# Patient Record
Sex: Female | Born: 1975 | Race: Black or African American | Hispanic: No | Marital: Single | Smoking: Never smoker
Health system: Southern US, Community
[De-identification: ages and names within clinical notes are randomized; demographics above are authoritative.]

## PROBLEM LIST (undated history)

## (undated) DIAGNOSIS — I10 Essential (primary) hypertension: Secondary | ICD-10-CM

## (undated) HISTORY — PX: TUBAL LIGATION: SHX77

## (undated) HISTORY — DX: Essential (primary) hypertension: I10

## (undated) HISTORY — PX: ABDOMINAL SURGERY: SHX537

---

## 1997-06-06 ENCOUNTER — Emergency Department (HOSPITAL_COMMUNITY): Admission: EM | Admit: 1997-06-06 | Discharge: 1997-06-06 | Payer: Self-pay | Admitting: Emergency Medicine

## 1998-11-17 ENCOUNTER — Emergency Department (HOSPITAL_COMMUNITY): Admission: EM | Admit: 1998-11-17 | Discharge: 1998-11-17 | Payer: Self-pay | Admitting: Emergency Medicine

## 1999-10-13 ENCOUNTER — Inpatient Hospital Stay (HOSPITAL_COMMUNITY): Admission: AD | Admit: 1999-10-13 | Discharge: 1999-10-13 | Payer: Self-pay | Admitting: Obstetrics & Gynecology

## 1999-10-14 ENCOUNTER — Encounter: Payer: Self-pay | Admitting: Obstetrics & Gynecology

## 1999-10-14 ENCOUNTER — Inpatient Hospital Stay (HOSPITAL_COMMUNITY): Admission: AD | Admit: 1999-10-14 | Discharge: 1999-10-14 | Payer: Self-pay | Admitting: Obstetrics & Gynecology

## 1999-11-05 ENCOUNTER — Other Ambulatory Visit: Admission: RE | Admit: 1999-11-05 | Discharge: 1999-11-05 | Payer: Self-pay | Admitting: Obstetrics

## 1999-12-02 ENCOUNTER — Emergency Department (HOSPITAL_COMMUNITY): Admission: EM | Admit: 1999-12-02 | Discharge: 1999-12-02 | Payer: Self-pay | Admitting: Emergency Medicine

## 2001-01-31 ENCOUNTER — Inpatient Hospital Stay (HOSPITAL_COMMUNITY): Admission: AD | Admit: 2001-01-31 | Discharge: 2001-01-31 | Payer: Self-pay | Admitting: Obstetrics & Gynecology

## 2001-03-17 ENCOUNTER — Inpatient Hospital Stay (HOSPITAL_COMMUNITY): Admission: AD | Admit: 2001-03-17 | Discharge: 2001-03-17 | Payer: Self-pay | Admitting: Obstetrics

## 2001-03-30 ENCOUNTER — Inpatient Hospital Stay (HOSPITAL_COMMUNITY): Admission: AD | Admit: 2001-03-30 | Discharge: 2001-04-01 | Payer: Self-pay | Admitting: Obstetrics

## 2001-03-30 ENCOUNTER — Encounter (INDEPENDENT_AMBULATORY_CARE_PROVIDER_SITE_OTHER): Payer: Self-pay

## 2001-06-09 ENCOUNTER — Encounter (HOSPITAL_BASED_OUTPATIENT_CLINIC_OR_DEPARTMENT_OTHER): Payer: Self-pay | Admitting: General Surgery

## 2001-06-10 ENCOUNTER — Encounter (INDEPENDENT_AMBULATORY_CARE_PROVIDER_SITE_OTHER): Payer: Self-pay | Admitting: *Deleted

## 2001-06-10 ENCOUNTER — Ambulatory Visit (HOSPITAL_COMMUNITY): Admission: RE | Admit: 2001-06-10 | Discharge: 2001-06-10 | Payer: Self-pay | Admitting: General Surgery

## 2004-01-29 ENCOUNTER — Inpatient Hospital Stay (HOSPITAL_COMMUNITY): Admission: AD | Admit: 2004-01-29 | Discharge: 2004-01-29 | Payer: Self-pay | Admitting: Obstetrics

## 2005-10-25 ENCOUNTER — Inpatient Hospital Stay (HOSPITAL_COMMUNITY): Admission: AD | Admit: 2005-10-25 | Discharge: 2005-10-26 | Payer: Self-pay | Admitting: Obstetrics & Gynecology

## 2006-03-01 ENCOUNTER — Emergency Department (HOSPITAL_COMMUNITY): Admission: EM | Admit: 2006-03-01 | Discharge: 2006-03-01 | Payer: Self-pay | Admitting: Emergency Medicine

## 2006-04-21 ENCOUNTER — Emergency Department (HOSPITAL_COMMUNITY): Admission: EM | Admit: 2006-04-21 | Discharge: 2006-04-21 | Payer: Self-pay | Admitting: *Deleted

## 2007-02-09 ENCOUNTER — Inpatient Hospital Stay (HOSPITAL_COMMUNITY): Admission: AD | Admit: 2007-02-09 | Discharge: 2007-02-09 | Payer: Self-pay | Admitting: Obstetrics & Gynecology

## 2007-04-25 ENCOUNTER — Emergency Department (HOSPITAL_COMMUNITY): Admission: EM | Admit: 2007-04-25 | Discharge: 2007-04-25 | Payer: Self-pay | Admitting: Family Medicine

## 2008-10-18 ENCOUNTER — Emergency Department (HOSPITAL_COMMUNITY): Admission: EM | Admit: 2008-10-18 | Discharge: 2008-10-18 | Payer: Self-pay | Admitting: Emergency Medicine

## 2008-10-20 ENCOUNTER — Emergency Department (HOSPITAL_COMMUNITY): Admission: EM | Admit: 2008-10-20 | Discharge: 2008-10-20 | Payer: Self-pay | Admitting: Emergency Medicine

## 2010-04-28 ENCOUNTER — Emergency Department (HOSPITAL_COMMUNITY)
Admission: EM | Admit: 2010-04-28 | Discharge: 2010-04-28 | Disposition: A | Discharge status: Home / Self Care | Payer: Self-pay | Attending: Emergency Medicine | Admitting: Emergency Medicine

## 2010-04-28 ENCOUNTER — Emergency Department (HOSPITAL_COMMUNITY): Payer: Self-pay

## 2010-04-28 DIAGNOSIS — R55 Syncope and collapse: Secondary | ICD-10-CM | POA: Insufficient documentation

## 2010-04-28 DIAGNOSIS — R42 Dizziness and giddiness: Secondary | ICD-10-CM | POA: Insufficient documentation

## 2010-04-28 LAB — COMPREHENSIVE METABOLIC PANEL
ALT: 12 U/L (ref 0–35)
AST: 16 U/L (ref 0–37)
CO2: 24 mEq/L (ref 19–32)
Calcium: 8.9 mg/dL (ref 8.4–10.5)
GFR calc Af Amer: >60 mL/min (ref >60)
GFR calc non Af Amer: >60 mL/min (ref >60)
Sodium: 139 mEq/L (ref 135–145)
Total Protein: 6.8 g/dL (ref 6.0–8.3)

## 2010-04-28 LAB — URINALYSIS, ROUTINE W REFLEX MICROSCOPIC
Bilirubin Urine: NEGATIVE
Glucose, UA: NEGATIVE mg/dL
Hgb urine dipstick: NEGATIVE
Ketones, ur: NEGATIVE mg/dL
pH: 7 (ref 5.0–8.0)

## 2010-04-28 LAB — DIFFERENTIAL
Basophils Absolute: 0 10*3/uL (ref 0.0–0.1)
Basophils Relative: 0 % (ref 0–1)
Monocytes Absolute: 0.4 10*3/uL (ref 0.1–1.0)
Neutro Abs: 2.7 10*3/uL (ref 1.7–7.7)
Neutrophils Relative %: 50 % (ref 43–77)

## 2010-04-28 LAB — CBC
Hemoglobin: 10.6 g/dL — ABNORMAL LOW (ref 12.0–15.0)
MCHC: 30.8 g/dL (ref 30.0–36.0)

## 2010-04-28 LAB — POCT CARDIAC MARKERS
CKMB, poc: <1 ng/mL — ABNORMAL LOW (ref 1.0–8.0)
Myoglobin, poc: 36 ng/mL (ref 12–200)
Troponin i, poc: <0.05 ng/mL (ref 0.00–0.09)

## 2010-05-03 LAB — COMPREHENSIVE METABOLIC PANEL
ALT: 16 U/L (ref 0–35)
CO2: 27 mEq/L (ref 19–32)
Calcium: 8.7 mg/dL (ref 8.4–10.5)
Creatinine, Ser: 0.92 mg/dL (ref 0.4–1.2)
GFR calc non Af Amer: >60 mL/min (ref >60)
Glucose, Bld: 108 mg/dL — ABNORMAL HIGH (ref 70–99)
Total Bilirubin: 0.6 mg/dL (ref 0.3–1.2)

## 2010-05-03 LAB — CBC
HCT: 33.4 % — ABNORMAL LOW (ref 36.0–46.0)
HCT: 34.4 % — ABNORMAL LOW (ref 36.0–46.0)
Hemoglobin: 11.1 g/dL — ABNORMAL LOW (ref 12.0–15.0)
Hemoglobin: 11.4 g/dL — ABNORMAL LOW (ref 12.0–15.0)
MCHC: 33 g/dL (ref 30.0–36.0)
MCHC: 33.3 g/dL (ref 30.0–36.0)
MCV: 84.7 fL (ref 78.0–100.0)
MCV: 85.4 fL (ref 78.0–100.0)
Platelets: 233 10*3/uL (ref 150–400)
RBC: 3.94 MIL/uL (ref 3.87–5.11)
RBC: 4.03 MIL/uL (ref 3.87–5.11)
RDW: 13.4 % (ref 11.5–15.5)
WBC: 4.9 10*3/uL (ref 4.0–10.5)

## 2010-05-03 LAB — DIFFERENTIAL
Basophils Absolute: 0 10*3/uL (ref 0.0–0.1)
Basophils Absolute: 0 10*3/uL (ref 0.0–0.1)
Basophils Relative: 0 % (ref 0–1)
Eosinophils Absolute: 0 10*3/uL (ref 0.0–0.7)
Eosinophils Absolute: 0 10*3/uL (ref 0.0–0.7)
Eosinophils Relative: 1 % (ref 0–5)
Lymphocytes Relative: 19 % (ref 12–46)
Lymphocytes Relative: 34 % (ref 12–46)
Lymphs Abs: 1.1 10*3/uL (ref 0.7–4.0)
Lymphs Abs: 1.7 10*3/uL (ref 0.7–4.0)
Monocytes Absolute: 0.5 10*3/uL (ref 0.1–1.0)
Monocytes Relative: 9 % (ref 3–12)
Neutro Abs: 2.7 10*3/uL (ref 1.7–7.7)
Neutrophils Relative %: 55 % (ref 43–77)
Neutrophils Relative %: 71 % (ref 43–77)

## 2010-05-03 LAB — URINALYSIS, ROUTINE W REFLEX MICROSCOPIC
Glucose, UA: NEGATIVE mg/dL
Ketones, ur: 40 mg/dL — AB
Ketones, ur: NEGATIVE mg/dL
Nitrite: NEGATIVE
Nitrite: POSITIVE — AB
Protein, ur: 30 mg/dL — AB
Specific Gravity, Urine: 1.026 (ref 1.005–1.030)
Urobilinogen, UA: 1 mg/dL (ref 0.0–1.0)
Urobilinogen, UA: 1 mg/dL (ref 0.0–1.0)
pH: 5.5 (ref 5.0–8.0)
pH: 6 (ref 5.0–8.0)

## 2010-05-03 LAB — CULTURE, BLOOD (ROUTINE X 2)
Culture: NO GROWTH
Culture: NO GROWTH

## 2010-05-03 LAB — BASIC METABOLIC PANEL
BUN: 10 mg/dL (ref 6–23)
CO2: 24 mEq/L (ref 19–32)
Calcium: 8.9 mg/dL (ref 8.4–10.5)
Chloride: 105 mEq/L (ref 96–112)
Creatinine, Ser: 0.78 mg/dL (ref 0.4–1.2)
GFR calc Af Amer: >60 mL/min (ref >60)
GFR calc non Af Amer: >60 mL/min (ref >60)
Glucose, Bld: 86 mg/dL (ref 70–99)
Potassium: 4 mEq/L (ref 3.5–5.1)
Sodium: 138 mEq/L (ref 135–145)

## 2010-05-03 LAB — POCT I-STAT, CHEM 8
BUN: 10 mg/dL (ref 6–23)
Chloride: 104 mEq/L (ref 96–112)
Creatinine, Ser: 0.9 mg/dL (ref 0.4–1.2)
Glucose, Bld: 84 mg/dL (ref 70–99)
HCT: 34 % — ABNORMAL LOW (ref 36.0–46.0)
Potassium: 4 mEq/L (ref 3.5–5.1)

## 2010-05-03 LAB — URINE MICROSCOPIC-ADD ON

## 2010-05-03 LAB — URINE CULTURE: Colony Count: >=100000

## 2010-05-03 LAB — PREGNANCY, URINE
Preg Test, Ur: NEGATIVE
Preg Test, Ur: NEGATIVE

## 2010-05-24 ENCOUNTER — Encounter: Payer: Self-pay | Admitting: Cardiology

## 2010-05-24 ENCOUNTER — Ambulatory Visit (INDEPENDENT_AMBULATORY_CARE_PROVIDER_SITE_OTHER): Payer: Self-pay | Admitting: Cardiology

## 2010-05-24 DIAGNOSIS — I10 Essential (primary) hypertension: Secondary | ICD-10-CM

## 2010-05-24 DIAGNOSIS — R55 Syncope and collapse: Secondary | ICD-10-CM

## 2010-05-24 NOTE — Patient Instructions (Signed)
Your physician has requested that you have an echocardiogram. Echocardiography is a painless test that uses sound waves to create images of your heart. It provides your doctor with information about the size and shape of your heart and how well your heart's chambers and valves are working. This procedure takes approximately one hour. There are no restrictions for this procedure.  Your physician has recommended that you wear an event monitor. Event monitors are medical devices that record the heart's electrical activity. Doctors most often [place] these monitors to diagnose arrhythmias. Arrhythmias are problems with the speed or rhythm of the heartbeat. The monitor is a small, portable device. You can wear one while you do your normal daily activities. This is usually used to diagnose what is causing palpitations/syncope (passing out).  Your physician recommends that you schedule a follow-up appointment in: 8 weeks.

## 2010-05-24 NOTE — Assessment & Plan Note (Signed)
Blood pressure now controlled. Continue present medications.

## 2010-05-24 NOTE — Assessment & Plan Note (Signed)
Etiology unclear. May have been orthostatic mediated. However blood pressure being elevated when EMS arrived would not seem consistent. Plan echocardiogram to quantify LV function. We'll also schedule [facility]. Patient instructed not to drive until workup complete.

## 2010-05-24 NOTE — Progress Notes (Signed)
HPI: 35 year old female with no prior cardiac history or evaluation of syncope. Patient seen in the emergency room on April 28, 2010 following a syncopal episode. Head CT was negative. Potassium mildly low at 3.4. Pregnancy test negative. Hemoglobin 10.6. One set of cardiac markers negative. Patient typically does not have dyspnea on exertion unless more extreme activities. No orthopnea, PND, pedal edema, palpitations, history of syncope or exertional chest pain. On the day of evaluation the patient states that she had dinner and fell asleep on her sofa. She awoke to open her front door. She felt mildly lightheaded when she arrived at the door and then had a cardiac syncopal episode. There was no associated chest pain, shortness of breath, palpitations, nausea, seizure activity or incontinence. There was no loss of strength or sensation in any extremity and no dysarthria. She was unconscious for approximately 1-2 minutes. She apparently was told her systolic blood pressure was 200 when EMS arrived. She is having no problems since then. She is now on blood pressure medications and feels better. Because of her syncope cardiology was asked to further evaluate.  Current Outpatient Prescriptions  Medication Sig Dispense Refill  . lisinopril-hydrochlorothiazide (PRINZIDE,ZESTORETIC) 20-12.5 MG per tablet Take 1 tablet by mouth daily.          No Known Allergies  Past Medical History  Diagnosis Date  . Hypertension     Past Surgical History  Procedure Date  . Tubal ligation   . Abdominal surgery     History   Social History  . Marital Status: Single    Spouse Name: N/A    Number of Children: 3  . Years of Education: N/A   Occupational History  .      Unemployed   Social History Main Topics  . Smoking status: Never Smoker   . Smokeless tobacco: Never Used  . Alcohol Use: No     occasional   . Drug Use: Yes    Special: Marijuana     quit 03/2010  . Sexually Active: Not on file   Other  Topics Concern  . Not on file   Social History Narrative  . No narrative on file    Family History  Problem Relation Age of Onset  . Sudden death      Father; circumstances unknown    ROS: no fevers or chills, productive cough, hemoptysis, dysphasia, odynophagia, melena, hematochezia, dysuria, hematuria, rash, seizure activity, orthopnea, PND, pedal edema, claudication. Remaining systems are negative.  Physical Exam: General:  Well developed/well nourished in NAD Skin warm/dry Patient not depressed No peripheral clubbing Back-normal HEENT-normal/normal eyelids Neck supple/normal carotid upstroke bilaterally; no bruits; no JVD; no thyromegaly chest - CTA/ normal expansion CV - RRR/normal S1 and S2; no murmurs, rubs or gallops;  PMI nondisplaced Abdomen -NT/ND, no HSM, no mass, + bowel sounds, no bruit 2+ femoral pulses, no bruits Ext-no edema, chords, 2+ DP Neuro-grossly nonfocal  ECG April 28, 2010, normal sinus rhythm with nonspecific T-wave changes. QT normal.

## 2010-06-07 ENCOUNTER — Ambulatory Visit (HOSPITAL_COMMUNITY): Payer: Self-pay | Attending: Cardiology | Admitting: Radiology

## 2010-06-07 ENCOUNTER — Encounter (INDEPENDENT_AMBULATORY_CARE_PROVIDER_SITE_OTHER): Payer: Self-pay

## 2010-06-07 DIAGNOSIS — R55 Syncope and collapse: Secondary | ICD-10-CM

## 2010-06-14 NOTE — Discharge Summary (Signed)
[facility]  Patient:    KIMBERLEY, SPEECE Visit Number: [id number] MRN: [id number]          Service Type: OBS Location: 910A 9139 01 Attending Physician:  Venita Sheffield Dictated by:   Kathreen Cosier, M.D. Admit Date:  03/30/2001 Discharge Date: 04/01/2001                             Discharge Summary  HISTORY OF PRESENT ILLNESS:   The patient is a 35 year old gravida 6, para 2-0-3-2, [facility] February 28 who was admitted for induction.  GBS was negative.  AROM was performed.  Fluid was meconium stained.  The patient had a normal vaginal delivery of a female.  Apgars 9 and 9 with a nuchal cord x1. The cord wrapped around the abdomen x1 and a true ______ cord x1.  She underwent a postpartum tubal on March 31, 2001 with no problem.  On admission, hemoglobin was 10.5.  Postop hemoglobin was 10.5.  She was discharged home on the second postpartum day, ambulatory, on a regular diet, on Tylox one p.o. every 3-4 hours p.r.n., to see me in six weeks.  DISCHARGE DIAGNOSIS:          Status post normal vaginal delivery at term and postpartum tubal ligation. Dictated by:   Kathreen Cosier, M.D. Attending Physician:  Venita Sheffield DD:  04/01/01 TD:  04/02/01 Job: [id number]

## 2010-06-14 NOTE — Op Note (Signed)
[place]  Patient:    Yvonne Murray, Yvonne Murray Visit Number: [id number] MRN: [id number]          Service Type: DSU Location: [facility] 2899 [id number] Attending Physician:  Sonda Primes Dictated by:   Mardene Celeste Lurene Shadow, M.D. Proc. Date: 06/10/01 Admit Date:  06/10/2001 Discharge Date: 06/10/2001                             Operative Report  PREOPERATIVE DIAGNOSIS:  Ventral hernia in the epigastrium.  POSTOPERATIVE DIAGNOSIS:  Ventral hernia in the epigastrium.  OPERATION PERFORMED:  Repair of ventral hernia with mesh.  SURGEON:  Mardene Celeste. Lurene Shadow, M.D.  ASSISTANT:  Nurse.  ANESTHESIA:  General.  INDICATIONS FOR PROCEDURE:  The patient is a 35 year old recently postpartum female presenting with a large incarcerated and painful epigastric mass which on evaluation is a large ventral hernia, she is brought to the operating room now after the risks and potential benefits of hernia repair have been fully discussed and she gives consent for surgery.  DESCRIPTION OF PROCEDURE:  Following induction of satisfactory anesthesia with the patient positioned supinely, the abdomen was prepped and draped to be included in the sterile operative field.  I made a midline incision over the mass which was located approximately 5 cm above the umbilicus and extending somewhat towards the right.  This was deepened through the skin and subcutaneous tissues down to the hernia mass which was dissected free on all sides, dissection carried down to fascial defect.  The fascial defect was extended slightly so as to completely prolapse the entire hernia which was made up primarily of falciform ligament.  The falciform ligament was then serially divided between clamps and secured between ties of 2-0 silk.  The hernial defect was then repaired and repaired with a polypropylene mesh plug sewn in with interrupted 0 Novofil sutures.  The repair was noted to be intact and the  wound closed in layers as follows.  The subcutaneous tissues were closed with interrupted 2-0 Vicryl sutures.  Skin closed with a running 4-0 Monocryl suture, reinforced with Steri-Strips and a sterile dressing applied. Anesthetic reversed.  Patient removed from the operating room to the recovery room in stable condition having tolerated the procedure well. Dictated by:   Mardene Celeste. Lurene Shadow, M.D. Attending Physician:  Sonda Primes DD:  06/10/01 TD:  06/11/01 Job: [id number]

## 2010-06-14 NOTE — Op Note (Signed)
[facility]  Patient:    Yvonne Murray, Yvonne Murray Visit Number: [id number] MRN: [id number]          Service Type: OBS Location: 910A 9139 01 Attending Physician:  Venita Sheffield Dictated by:   Kathreen Cosier, M.D. Proc. Date: 03/31/01 Admit Date:  03/30/2001                             Operative Report  PREOPERATIVE DIAGNOSIS:       Multiparity.  PROCEDURE:                    Postpartum tubal ligation.  SURGEON:                      Kathreen Cosier, M.D.  DESCRIPTION OF PROCEDURE:     Under general anesthesia with the patient in the supine position, the abdomen was prepped and draped.  The bladder was emptied with a straight catheter.  A subumbilical incision approximately one inch long was made and carried down to the fascia.  The fascia was cleaned and grasped with two Kochers.  The fascia and the peritoneum were opened with Mayo scissors.  The left tube was grasped in the midportion with a Babcock clamp. A 0 plain suture was placed in the mesosalpinx below the portion of the tube within the clamp.  The was also traced to the fimbria.  Approximately one inch of tube was transected.  The procedure was done in a similar fashion on the right side, the tube being traced to the fimbria.  Lap and sponge counts were correct.  The abdomen was closed in layers, the peritoneum and fascia with continuous suture of 0 Dexon.  The skin was closed with subcuticular suture of 3-0 plain. Dictated by:   Kathreen Cosier, M.D. Attending Physician:  Venita Sheffield DD:  03/31/01 TD:  03/31/01 Job: [id number]

## 2010-07-25 ENCOUNTER — Ambulatory Visit: Payer: Self-pay | Admitting: Cardiology

## 2010-08-21 ENCOUNTER — Encounter: Payer: Self-pay | Admitting: Cardiology

## 2010-08-22 ENCOUNTER — Encounter: Payer: Self-pay | Admitting: Cardiology

## 2010-08-22 ENCOUNTER — Ambulatory Visit (INDEPENDENT_AMBULATORY_CARE_PROVIDER_SITE_OTHER): Payer: Self-pay | Admitting: Cardiology

## 2010-08-22 VITALS — BP 121/77 | HR 58 | Resp 14 | Ht 63.0 in | Wt 174.0 lb

## 2010-08-22 DIAGNOSIS — R55 Syncope and collapse: Secondary | ICD-10-CM

## 2010-08-22 DIAGNOSIS — I10 Essential (primary) hypertension: Secondary | ICD-10-CM

## 2010-08-22 NOTE — Progress Notes (Signed)
HPI: 35 year old female I initially saw in April of 2012 for evaluation of syncope. Patient seen in the emergency room on April 28, 2010 following a syncopal episode. Head CT was negative. Potassium mildly low at 3.4. Pregnancy test negative. Hemoglobin 10.6. One set of cardiac markers negative. Echo 5/12 showed normal LV function. Since I last saw her, the patient has dyspnea with more extreme activities but not with routine activities. It is relieved with rest. It is not associated with chest pain. There is no orthopnea, PND or pedal edema. There is no syncope or palpitations. There is no exertional chest pain.    Current Outpatient Prescriptions  Medication Sig Dispense Refill  . lisinopril-hydrochlorothiazide (PRINZIDE,ZESTORETIC) 20-12.5 MG per tablet Take 1 tablet by mouth daily.           Past Medical History  Diagnosis Date  . Hypertension     Past Surgical History  Procedure Date  . Tubal ligation   . Abdominal surgery     History   Social History  . Marital Status: Single    Spouse Name: N/A    Number of Children: 3  . Years of Education: N/A   Occupational History  .      Unemployed   Social History Main Topics  . Smoking status: Never Smoker   . Smokeless tobacco: Never Used  . Alcohol Use: No     occasional   . Drug Use: Yes    Special: Marijuana     quit 03/2010  . Sexually Active: Not on file   Other Topics Concern  . Not on file   Social History Narrative  . No narrative on file    ROS: no fevers or chills, productive cough, hemoptysis, dysphasia, odynophagia, melena, hematochezia, dysuria, hematuria, rash, seizure activity, orthopnea, PND, pedal edema, claudication. Remaining systems are negative.  Physical Exam: Well-developed well-nourished in no acute distress.  Skin is warm and dry.  HEENT is normal.  Neck is supple. No thyromegaly.  Chest is clear to auscultation with normal expansion.  Cardiovascular exam is regular rate and rhythm.    Abdominal exam nontender or distended. No masses palpated. Extremities show no edema. neuro grossly intact  ECG sinus rhythm at a rate of 58. Nonspecific ST changes.

## 2010-08-22 NOTE — Assessment & Plan Note (Signed)
Etiology remains unclear. LV function normal. No further episodes. She did not wear her monitor. We will not pursue further evaluation unless she has recurrent episodes in the future.

## 2010-08-22 NOTE — Assessment & Plan Note (Signed)
Blood pressure controlled. Continue present medications. Followup primary care.

## 2010-09-22 IMAGING — CR DG CHEST 2V
2 series · 2 of 2 positions shown · non-contrast
Comparison: Chest radiograph 03/01/2006

CLINICAL DATA: Fever and body aches

CHEST - 2 VIEW

[view not recorded (1 of 2)]
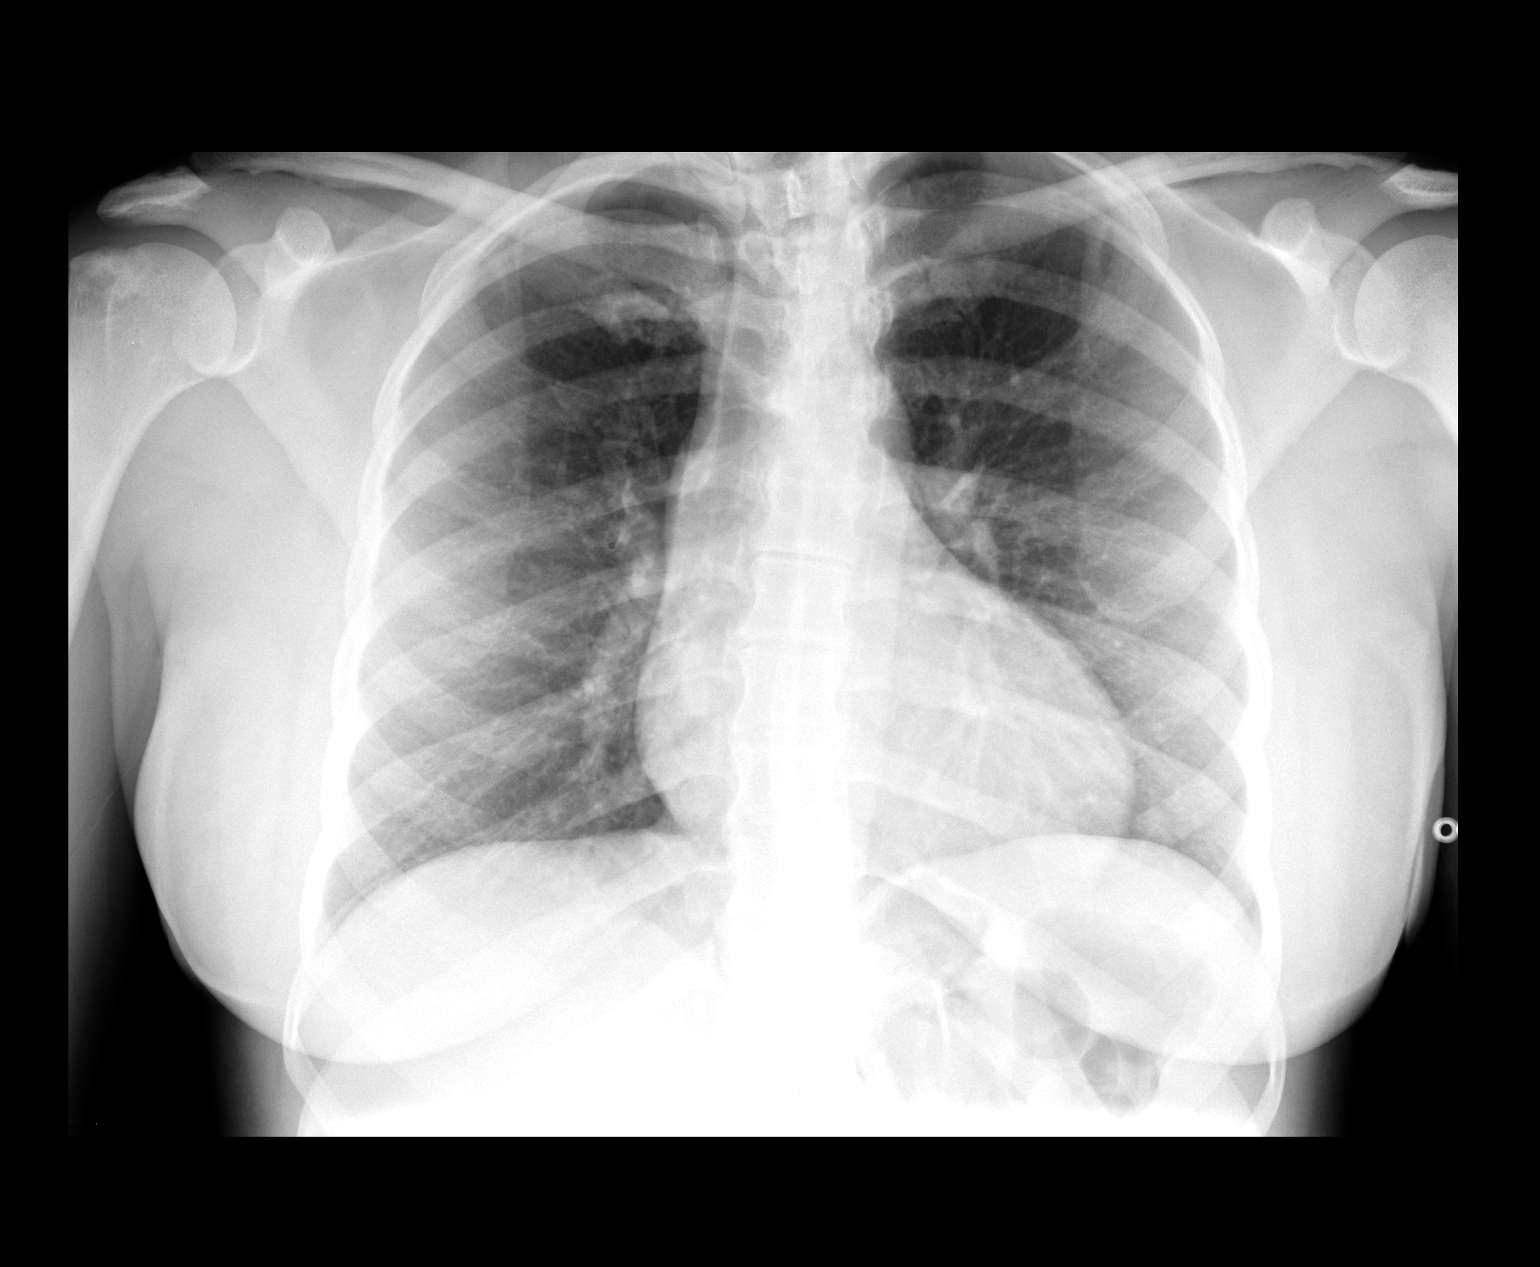

[view not recorded (2 of 2)]
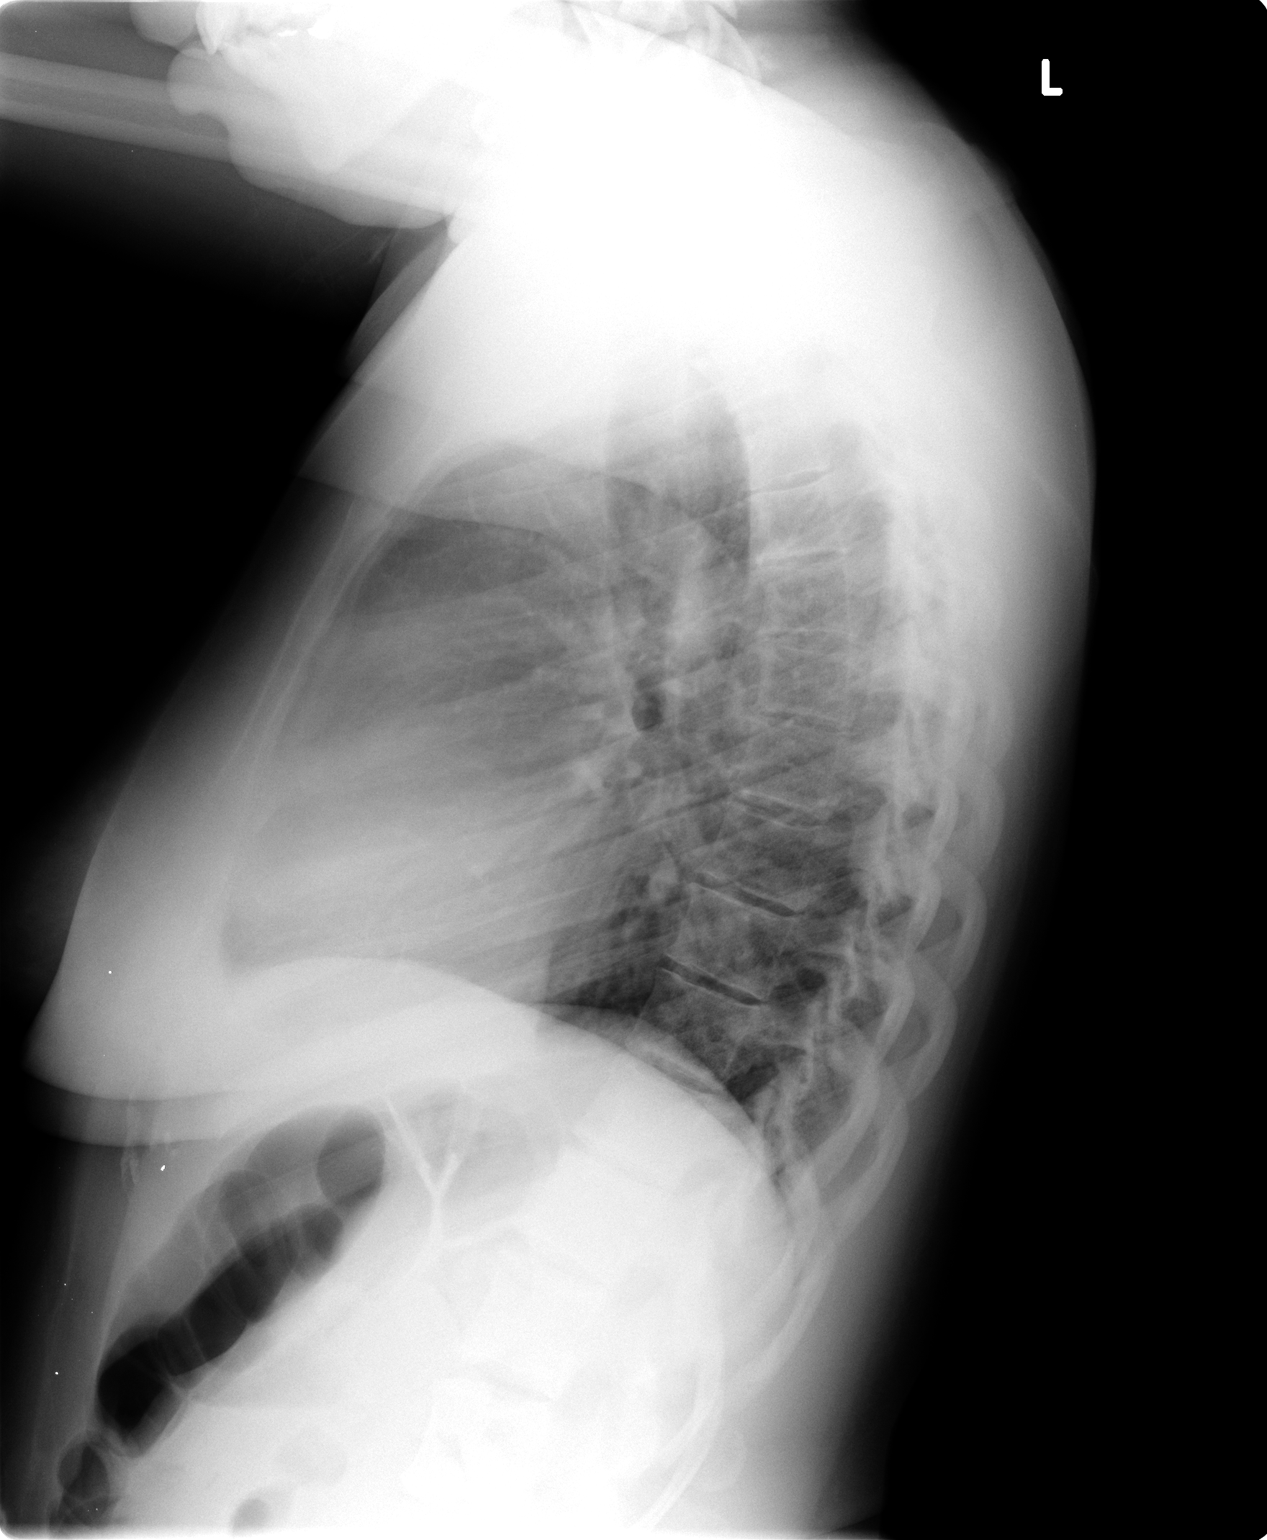

[2 of 2 positions shown; findings below may reference images not displayed]

FINDINGS: Normal mediastinum and heart silhouette.  Costophrenic
angles are clear.  No evidence effusion, infiltrate, or
pneumothorax.
IMPRESSION: No acute cardiopulmonary process.

## 2010-09-24 IMAGING — CT CT ABDOMEN W/O CM
2 series · 13 of 40 positions shown, 19 images · non-contrast
Comparison: No similar prior study is available for comparison.

CT ABDOMEN

CLINICAL DATA: Right-sided flank pain.  History of pyelonephritis.

CT OF THE ABDOMEN AND PELVIS WITHOUT CONTRAST (CT UROGRAM)
TECHNIQUE: Multidetector CT imaging was performed through the
abdomen and pelvis to include the urinary tract.

[Series 602: coronal · coronal · 0.77mm/px · 12 of 69 slices shown, 17 images]
[im 6/69  soft-tissue]
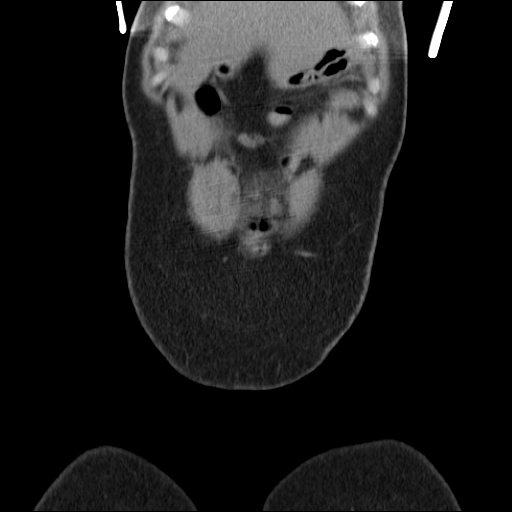
[im 6/69  lung]
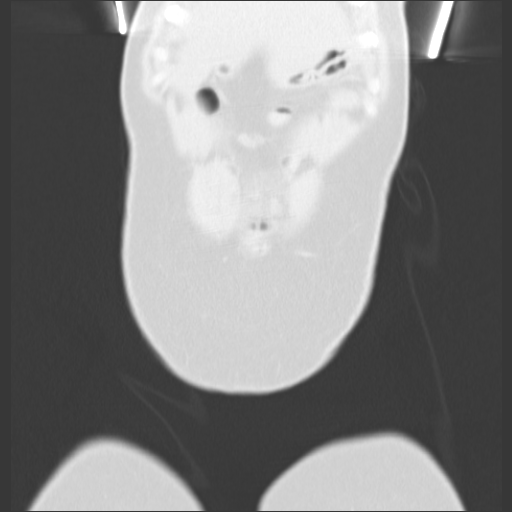
[im 6/69  bone]
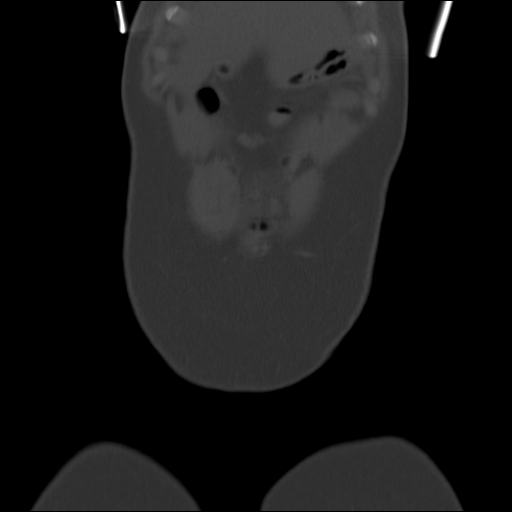
[im 11/69  soft-tissue]
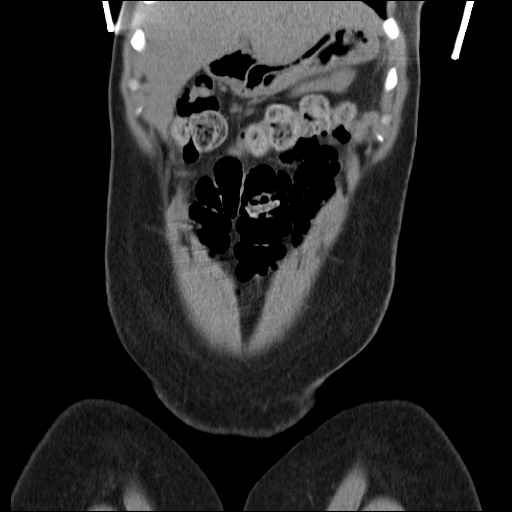
[im 11/69  lung]
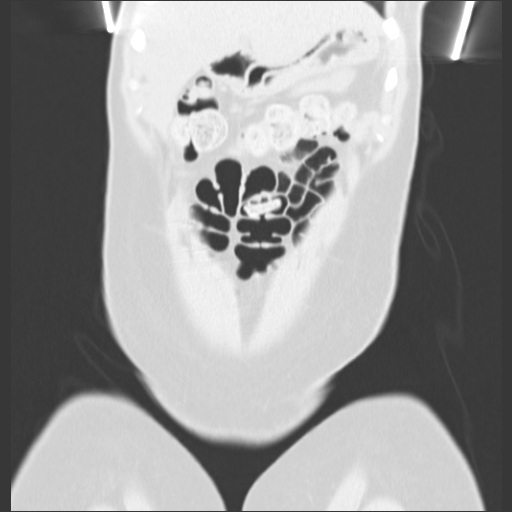
[im 16/69  lung]
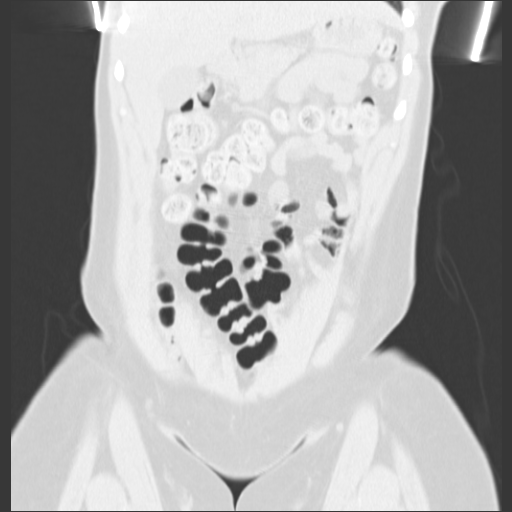
[im 21/69  soft-tissue]
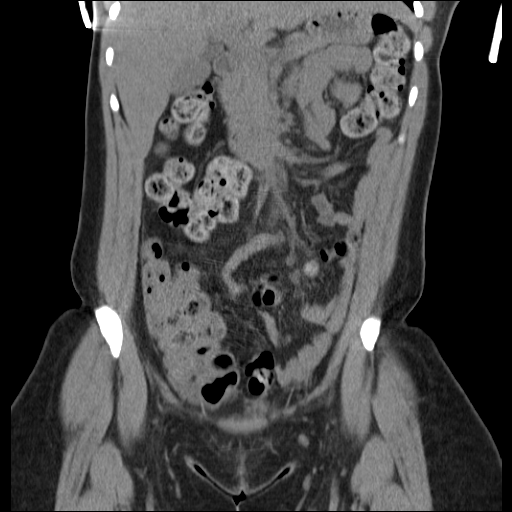
[im 21/69  lung]
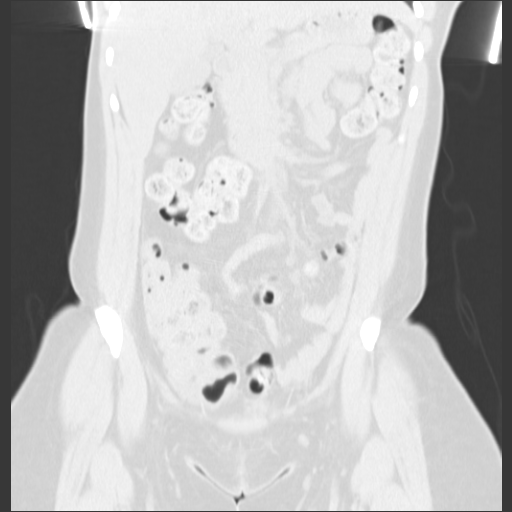
[im 23/69  soft-tissue]
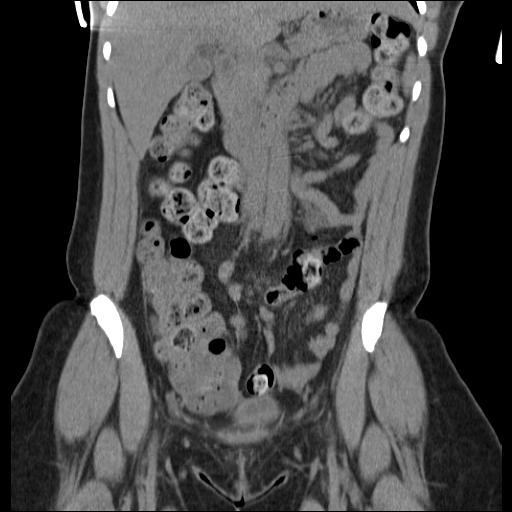
[im 31/69  soft-tissue]
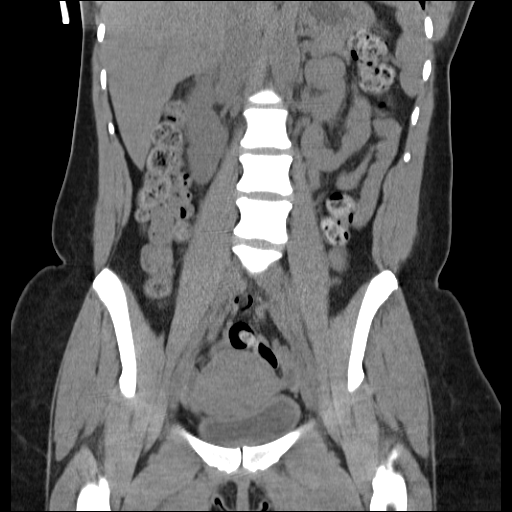
[im 37/69  soft-tissue]
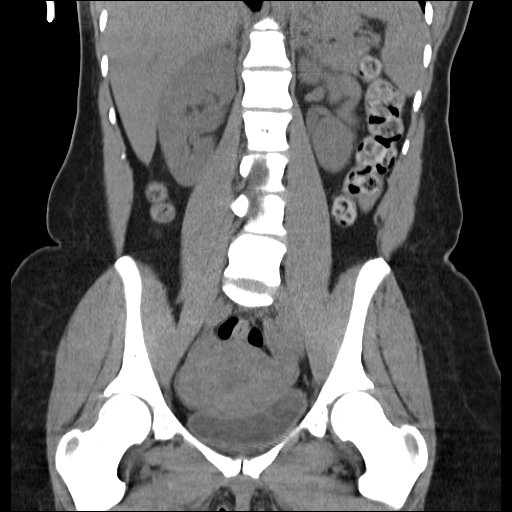
[im 38/69  soft-tissue]
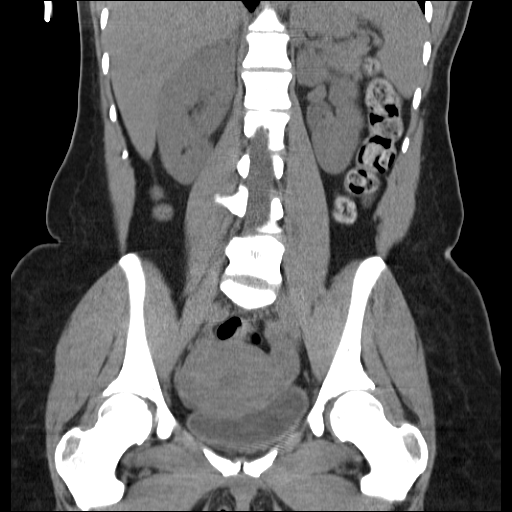
[im 46/69  soft-tissue]
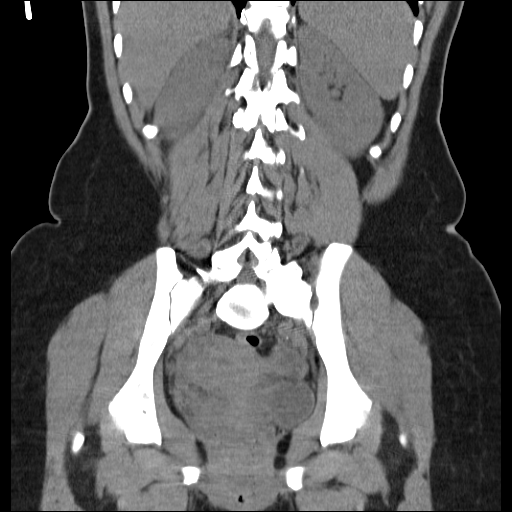
[im 48/69  soft-tissue]
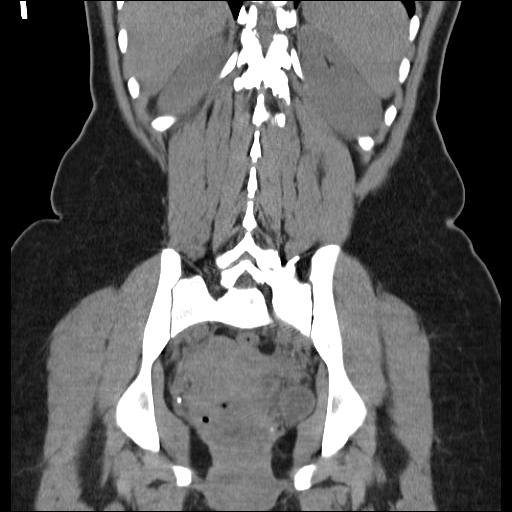
[im 48/69  bone]
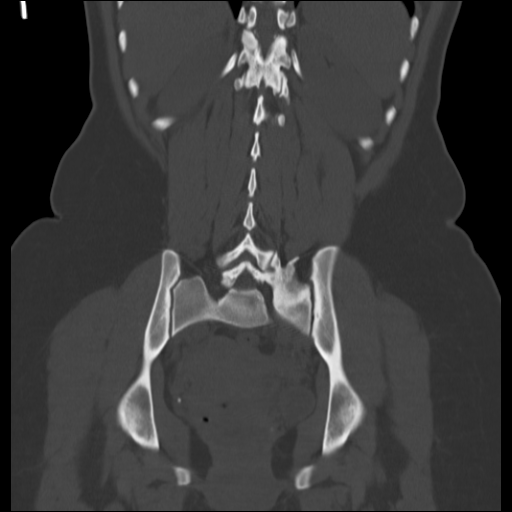
[im 58/69  soft-tissue]
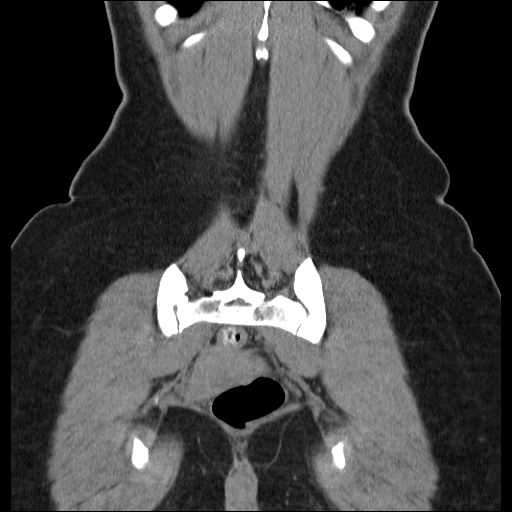
[im 63/69  soft-tissue]
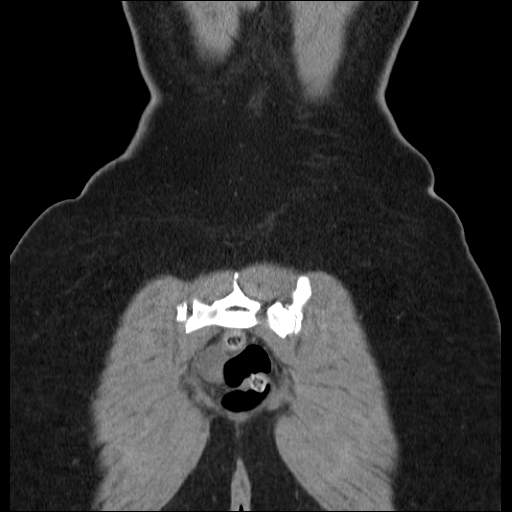

[Series 603: sagittal · sagittal · 0.77mm/px · 1 of 92 slices shown, 2 images]
[im 43/92  soft-tissue]
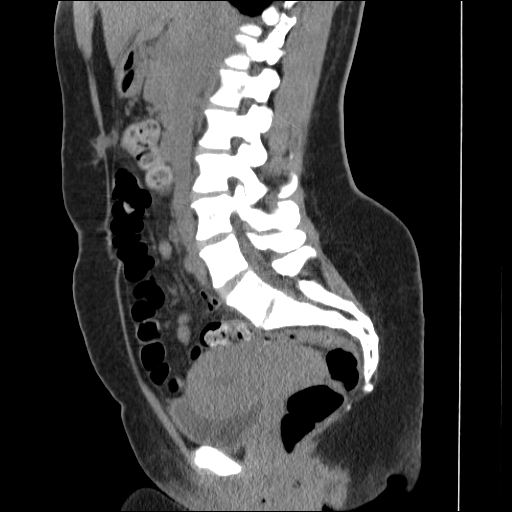
[im 43/92  bone]
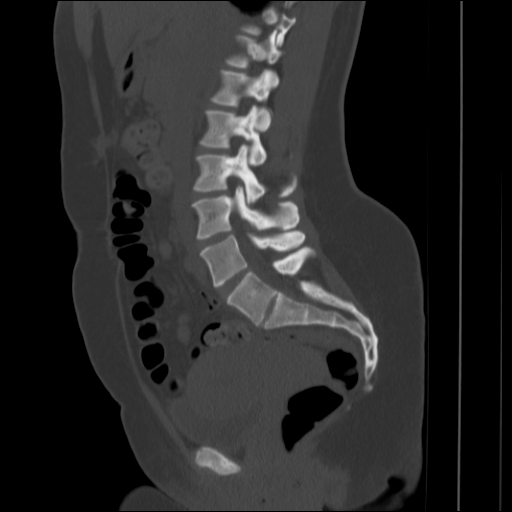

[13 of 40 positions shown; findings below may reference images not displayed]

FINDINGS: There is minimal fullness of the right upper pole
intrarenal collecting system but no overt hydronephrosis or
hydroureter.  No perinephric stranding or fluid collection.  The
kidneys themselves are not well evaluated on this noncontrast
examination.  No radiopaque renal calculus.
IMPRESSION: No definite acute intra-abdominal finding.  Please see CT pelvis
report below.

CT PELVIS
FINDINGS: A 4 mm radiopacity in the right hemi pelvis on image 61
is most likely a phlebolith, although the course of the distal
right ureter is not well defined without IV contrast or oral
contrast.  Trace pelvic free fluid is incidentally noted.  Uterus
and ovaries are normal.  The appendix is normal.  Bowel is
unremarkable.  No acute osseous finding.
IMPRESSION: No acute intrapelvic finding.  Probable phlebolith in the right
hemi pelvis, although if the patient had an anatomic variant, a
distal right ureteral calculus is possible given the history and
symptoms.

## 2010-10-16 LAB — CBC
HCT: 34.7 — ABNORMAL LOW
Platelets: 251
RBC: 4.07
WBC: 4.2

## 2010-10-16 LAB — URINALYSIS, ROUTINE W REFLEX MICROSCOPIC
Bilirubin Urine: NEGATIVE
Glucose, UA: NEGATIVE
Hgb urine dipstick: NEGATIVE
Nitrite: NEGATIVE
Specific Gravity, Urine: >1.03 — ABNORMAL HIGH
pH: 5.5

## 2010-10-16 LAB — WET PREP, GENITAL: Yeast Wet Prep HPF POC: NONE SEEN

## 2010-10-16 LAB — GC/CHLAMYDIA PROBE AMP, GENITAL
Chlamydia, DNA Probe: POSITIVE — AB
GC Probe Amp, Genital: NEGATIVE

## 2010-10-16 LAB — POCT PREGNANCY, URINE: Operator id: 280671

## 2011-12-08 ENCOUNTER — Emergency Department (HOSPITAL_COMMUNITY)
Admission: EM | Admit: 2011-12-08 | Discharge: 2011-12-08 | Disposition: A | Discharge status: Home / Self Care | Payer: Self-pay | Attending: Emergency Medicine | Admitting: Emergency Medicine

## 2011-12-08 ENCOUNTER — Encounter (HOSPITAL_COMMUNITY): Payer: Self-pay | Admitting: *Deleted

## 2011-12-08 DIAGNOSIS — T148XXA Other injury of unspecified body region, initial encounter: Secondary | ICD-10-CM

## 2011-12-08 DIAGNOSIS — Y9389 Activity, other specified: Secondary | ICD-10-CM | POA: Insufficient documentation

## 2011-12-08 DIAGNOSIS — M436 Torticollis: Secondary | ICD-10-CM | POA: Insufficient documentation

## 2011-12-08 DIAGNOSIS — IMO0001 Reserved for inherently not codable concepts without codable children: Secondary | ICD-10-CM | POA: Insufficient documentation

## 2011-12-08 DIAGNOSIS — M549 Dorsalgia, unspecified: Secondary | ICD-10-CM | POA: Insufficient documentation

## 2011-12-08 DIAGNOSIS — I10 Essential (primary) hypertension: Secondary | ICD-10-CM

## 2011-12-08 DIAGNOSIS — Z79899 Other long term (current) drug therapy: Secondary | ICD-10-CM | POA: Insufficient documentation

## 2011-12-08 DIAGNOSIS — Y9289 Other specified places as the place of occurrence of the external cause: Secondary | ICD-10-CM | POA: Insufficient documentation

## 2011-12-08 DIAGNOSIS — X500XXA Overexertion from strenuous movement or load, initial encounter: Secondary | ICD-10-CM | POA: Insufficient documentation

## 2011-12-08 DIAGNOSIS — M542 Cervicalgia: Secondary | ICD-10-CM | POA: Insufficient documentation

## 2011-12-08 MED ORDER — OXYCODONE-ACETAMINOPHEN 5-325 MG PO TABS: 2.0000 | ORAL_TABLET | ORAL | Status: AC | PRN

## 2011-12-08 MED ORDER — OXYCODONE-ACETAMINOPHEN 5-325 MG PO TABS
2.0000 | ORAL_TABLET | Freq: Once | ORAL | Status: AC
  Administered 2011-12-08: 2 via ORAL
  Filled 2011-12-08: qty 2

## 2011-12-08 MED ORDER — HYDROCHLOROTHIAZIDE 12.5 MG PO TABS: 12.5000 mg | ORAL_TABLET | Freq: Every day | ORAL | Status: AC

## 2011-12-08 NOTE — ED Notes (Signed)
Pt c/o neck, upper back, upper chest, and bilateral shoulder pain since past Sat. Pt states she was lifting chairs and tables on that day and experienced pain afterwards. Pt denies headache, low back pain, n/v/d and has not had a fever.

## 2011-12-08 NOTE — ED Provider Notes (Signed)
Medical screening examination/treatment/procedure(s) were performed by non-physician practitioner and as supervising physician I was immediately available for consultation/collaboration.  Sunnie Nielsen, MD 12/08/11 [phone]

## 2011-12-08 NOTE — ED Provider Notes (Signed)
History     CSN: [id number]  Arrival date & time 12/08/11  1308   First MD Initiated Contact with Patient 12/08/11 [phone]      Chief Complaint  Patient presents with  . Torticollis    (Consider location/radiation/quality/duration/timing/severity/associated sxs/prior treatment) HPI Comments: Patient is a 36 year old female who presents with upper back and neck pain that started 2 days ago after moving chairs and tables for a party. Patient reports a gradual onset and progressive worsening of throbbing pain in her upper back and neck that is severe and made worse with neck and upper extremity movement. Patient has not tried anything for symptom relief. The pain does not radiate. No alleviating factors. Patient denies associated symptoms. Patient denies headache, NVD, visual changes, difficulty swallowing, chest pain, SOB, extremity numbness or tingling, abdominal pain.    Past Medical History  Diagnosis Date  . Hypertension     Past Surgical History  Procedure Date  . Tubal ligation   . Abdominal surgery     Family History  Problem Relation Age of Onset  . Sudden death      Father; circumstances unknown    History  Substance Use Topics  . Smoking status: Never Smoker   . Smokeless tobacco: Never Used  . Alcohol Use: Yes     Comment: occasional     OB History    Grav Para Term Preterm Abortions TAB SAB Ect Mult Living                  Review of Systems  HENT: Positive for neck pain and neck stiffness.   Musculoskeletal: Positive for myalgias and back pain.  All other systems reviewed and are negative.    Allergies  Lisinopril  Home Medications   Current Outpatient Rx  Name  Route  Sig  Dispense  Refill  . LISINOPRIL-HYDROCHLOROTHIAZIDE 20-12.5 MG PO TABS   Oral   Take 1 tablet by mouth daily.             BP 169/99  Pulse 60  Temp 98.7 F (37.1 C) (Oral)  Resp 19  Ht 5\' 3"  (1.6 m)  Wt 165 lb (74.844 kg)  BMI 29.23 kg/m2  SpO2 98%  LMP  11/10/2011  Physical Exam  Nursing note and vitals reviewed. Constitutional: She is oriented to person, place, and time. She appears well-developed and well-nourished. No distress.  HENT:  Head: Normocephalic and atraumatic.  Mouth/Throat: Oropharynx is clear and moist. No oropharyngeal exudate.  Eyes: Conjunctivae normal and EOM are normal. Pupils are equal, round, and reactive to light.  Neck: Neck supple.       ROM limited in all directions due to soreness. Generalized neck tenderness to palpation.   Cardiovascular: Normal rate, regular rhythm and intact distal pulses.  Exam reveals no gallop and no friction rub.   No murmur heard. Pulmonary/Chest: Effort normal and breath sounds normal. She has no wheezes. She has no rales. She exhibits tenderness.       Mild central chest tenderness to palpation.   Abdominal: Soft. She exhibits no distension.  Musculoskeletal: Normal range of motion.       Generalized tenderness to palpation of upper back. No midline spine tenderness to palpation or step off noted.   Lymphadenopathy:    She has no cervical adenopathy.  Neurological: She is alert and oriented to person, place, and time. Coordination normal.       Strength and sensation equal and intact bilaterally. Speech is goal-oriented.  Moves limbs without ataxia.   Skin: Skin is warm and dry. She is not diaphoretic.  Psychiatric: She has a normal mood and affect. Her behavior is normal.    ED Course  Procedures (including critical care time)  Labs Reviewed - No data to display No results found.   1. Muscle strain   2. Hypertension       MDM  6:54 AM Patient will have Percocet for pain control for muscle soreness that occurred after lifting chairs and tables for a party 2 nights ago. Patient will also start taking HCTZ for blood pressure. Patient has a diagnosis of HTN but is currently not taking anything due to adverse side effects with lisinopril. No imaging needed at this time.        Emilia Beck, [place] 12/08/11 [phone]

## 2011-12-08 NOTE — ED Notes (Signed)
Pt states that her neck is sore. Denies N/V or fever.

## 2017-09-01 ENCOUNTER — Ambulatory Visit: Payer: Self-pay | Admitting: Nurse Practitioner

## 2017-09-01 VITALS — BP 146/108 | HR 70 | Temp 98.7°F | Resp 17 | Wt 173.2 lb

## 2017-09-01 DIAGNOSIS — Z0000 Encounter for general adult medical examination without abnormal findings: Secondary | ICD-10-CM

## 2017-09-01 DIAGNOSIS — R030 Elevated blood-pressure reading, without diagnosis of hypertension: Secondary | ICD-10-CM

## 2017-09-01 NOTE — Patient Instructions (Signed)
Managing Your Hypertension Hypertension is commonly called high blood pressure. This is when the force of your blood pressing against the walls of your arteries is too strong. Arteries are blood vessels that carry blood from your heart throughout your body. Hypertension forces the heart to work harder to pump blood, and may cause the arteries to become narrow or stiff. Having untreated or uncontrolled hypertension can cause heart attack, stroke, kidney disease, and other problems. What are blood pressure readings? A blood pressure reading consists of a higher number over a lower number. Ideally, your blood pressure should be below 120/80. The first ("top") number is called the systolic pressure. It is a measure of the pressure in your arteries as your heart beats. The second ("bottom") number is called the diastolic pressure. It is a measure of the pressure in your arteries as the heart relaxes. What does my blood pressure reading mean? Blood pressure is classified into four stages. Based on your blood pressure reading, your health care provider may use the following stages to determine what type of treatment you need, if any. Systolic pressure and diastolic pressure are measured in a unit called mm Hg. Normal  Systolic pressure: below 120.  Diastolic pressure: below 80. Elevated  Systolic pressure: 120-129.  Diastolic pressure: below 80. Hypertension stage 1  Systolic pressure: 130-139.  Diastolic pressure: 80-89. Hypertension stage 2  Systolic pressure: 140 or above.  Diastolic pressure: 90 or above. What health risks are associated with hypertension? Managing your hypertension is an important responsibility. Uncontrolled hypertension can lead to:  A heart attack.  A stroke.  A weakened blood vessel (aneurysm).  Heart failure.  Kidney damage.  Eye damage.  Metabolic syndrome.  Memory and concentration problems.  What changes can I make to manage my  hypertension? Hypertension can be managed by making lifestyle changes and possibly by taking medicines. Your health care provider will help you make a plan to bring your blood pressure within a normal range. Eating and drinking  Eat a diet that is high in fiber and potassium, and low in salt (sodium), added sugar, and fat. An example eating plan is called the DASH (Dietary Approaches to Stop Hypertension) diet. To eat this way: ? Eat plenty of fresh fruits and vegetables. Try to fill half of your plate at each meal with fruits and vegetables. ? Eat whole grains, such as whole wheat pasta, brown rice, or whole grain bread. Fill about one quarter of your plate with whole grains. ? Eat low-fat diary products. ? Avoid fatty cuts of meat, processed or cured meats, and poultry with skin. Fill about one quarter of your plate with lean proteins such as fish, chicken without skin, beans, eggs, and tofu. ? Avoid premade and processed foods. These tend to be higher in sodium, added sugar, and fat.  Reduce your daily sodium intake. Most people with hypertension should eat less than 1,500 mg of sodium a day.  Limit alcohol intake to no more than 1 drink a day for nonpregnant women and 2 drinks a day for men. One drink equals 12 oz of beer, 5 oz of wine, or 1 oz of hard liquor. Lifestyle  Work with your health care provider to maintain a healthy body weight, or to lose weight. Ask what an ideal weight is for you.  Get at least 30 minutes of exercise that causes your heart to beat faster (aerobic exercise) most days of the week. Activities may include walking, swimming, or biking.  Include exercise   to strengthen your muscles (resistance exercise), such as weight lifting, as part of your weekly exercise routine. Try to do these types of exercises for 30 minutes at least 3 days a week.  Do not use any products that contain nicotine or tobacco, such as cigarettes and e-cigarettes. If you need help quitting, ask  your health care provider.  Control any long-term (chronic) conditions you have, such as high cholesterol or diabetes. Monitoring  Monitor your blood pressure at home as told by your health care provider. Your personal target blood pressure may vary depending on your medical conditions, your age, and other factors.  Have your blood pressure checked regularly, as often as told by your health care provider. Working with your health care provider  Review all the medicines you take with your health care provider because there may be side effects or interactions.  Talk with your health care provider about your diet, exercise habits, and other lifestyle factors that may be contributing to hypertension.  Visit your health care provider regularly. Your health care provider can help you create and adjust your plan for managing hypertension. Will I need medicine to control my blood pressure? Your health care provider may prescribe medicine if lifestyle changes are not enough to get your blood pressure under control, and if:  Your systolic blood pressure is 130 or higher.  Your diastolic blood pressure is 80 or higher.  Take medicines only as told by your health care provider. Follow the directions carefully. Blood pressure medicines must be taken as prescribed. The medicine does not work as well when you skip doses. Skipping doses also puts you at risk for problems. Contact a health care provider if:  You think you are having a reaction to medicines you have taken.  You have repeated (recurrent) headaches.  You feel dizzy.  You have swelling in your ankles.  You have trouble with your vision. Get help right away if:  You develop a severe headache or confusion.  You have unusual weakness or numbness, or you feel faint.  You have severe pain in your chest or abdomen.  You vomit repeatedly.  You have trouble breathing. Summary  Hypertension is when the force of blood pumping through  your arteries is too strong. If this condition is not controlled, it may put you at risk for serious complications.  Your personal target blood pressure may vary depending on your medical conditions, your age, and other factors. For most people, a normal blood pressure is less than 120/80.  Hypertension is managed by lifestyle changes, medicines, or both. Lifestyle changes include weight loss, eating a healthy, low-sodium diet, exercising more, and limiting alcohol. This information is not intended to replace advice given to you by your health care provider. Make sure you discuss any questions you have with your health care provider. Document Released: 10/08/2011 Document Revised: 12/12/2015 Document Reviewed: 12/12/2015 Elsevier Interactive Patient Education  2018 [place] Eating Plan DASH stands for "Dietary Approaches to Stop Hypertension." The DASH eating plan is a healthy eating plan that has been shown to reduce high blood pressure (hypertension). It may also reduce your risk for type 2 diabetes, heart disease, and stroke. The DASH eating plan may also help with weight loss. What are tips for following this plan? General guidelines  Avoid eating more than 2,300 mg (milligrams) of salt (sodium) a day. If you have hypertension, you may need to reduce your sodium intake to 1,500 mg a day.  Limit alcohol intake  to no more than 1 drink a day for nonpregnant women and 2 drinks a day for men. One drink equals 12 oz of beer, 5 oz of wine, or 1 oz of hard liquor.  Work with your health care provider to maintain a healthy body weight or to lose weight. Ask what an ideal weight is for you.  Get at least 30 minutes of exercise that causes your heart to beat faster (aerobic exercise) most days of the week. Activities may include walking, swimming, or biking.  Work with your health care provider or diet and nutrition specialist (dietitian) to adjust your eating plan to your individual  calorie needs. Reading food labels  Check food labels for the amount of sodium per serving. Choose foods with less than 5 percent of the Daily Value of sodium. Generally, foods with less than 300 mg of sodium per serving fit into this eating plan.  To find whole grains, look for the word "whole" as the first word in the ingredient list. Shopping  Buy products labeled as "low-sodium" or "no salt added."  Buy fresh foods. Avoid canned foods and premade or frozen meals. Cooking  Avoid adding salt when cooking. Use salt-free seasonings or herbs instead of table salt or sea salt. Check with your health care provider or pharmacist before using salt substitutes.  Do not fry foods. Cook foods using healthy methods such as baking, boiling, grilling, and broiling instead.  Cook with heart-healthy oils, such as olive, canola, soybean, or sunflower oil. Meal planning   Eat a balanced diet that includes: ? 5 or more servings of fruits and vegetables each day. At each meal, try to fill half of your plate with fruits and vegetables. ? Up to 6-8 servings of whole grains each day. ? Less than 6 oz of lean meat, poultry, or fish each day. A 3-oz serving of meat is about the same size as a deck of cards. One egg equals 1 oz. ? 2 servings of low-fat dairy each day. ? A serving of nuts, seeds, or beans 5 times each week. ? Heart-healthy fats. Healthy fats called Omega-3 fatty acids are found in foods such as flaxseeds and coldwater fish, like sardines, salmon, and mackerel.  Limit how much you eat of the following: ? Canned or prepackaged foods. ? Food that is high in trans fat, such as fried foods. ? Food that is high in saturated fat, such as fatty meat. ? Sweets, desserts, sugary drinks, and other foods with added sugar. ? Full-fat dairy products.  Do not salt foods before eating.  Try to eat at least 2 vegetarian meals each week.  Eat more home-cooked food and less restaurant, buffet, and fast  food.  When eating at a restaurant, ask that your food be prepared with less salt or no salt, if possible. What foods are recommended? The items listed may not be a complete list. Talk with your dietitian about what dietary choices are best for you. Grains Whole-grain or whole-wheat bread. Whole-grain or whole-wheat pasta. Brown rice. [place]. Bulgur. Whole-grain and low-sodium cereals. Pita bread. Low-fat, low-sodium crackers. Whole-wheat flour tortillas. Vegetables Fresh or frozen vegetables (raw, steamed, roasted, or grilled). Low-sodium or reduced-sodium tomato and vegetable juice. Low-sodium or reduced-sodium tomato sauce and tomato paste. Low-sodium or reduced-sodium canned vegetables. Fruits All fresh, dried, or frozen fruit. Canned fruit in natural juice (without added sugar). Meat and other protein foods Skinless chicken or [place]. Ground chicken or [place]. Pork with fat trimmed off. Fish  and seafood. Egg whites. Dried beans, peas, or lentils. Unsalted nuts, nut butters, and seeds. Unsalted canned beans. Lean cuts of beef with fat trimmed off. Low-sodium, lean deli meat. Dairy Low-fat (1%) or fat-free (skim) milk. Fat-free, low-fat, or reduced-fat cheeses. Nonfat, low-sodium ricotta or cottage cheese. Low-fat or nonfat yogurt. Low-fat, low-sodium cheese. Fats and oils Soft margarine without trans fats. Vegetable oil. Low-fat, reduced-fat, or light mayonnaise and salad dressings (reduced-sodium). Canola, safflower, olive, soybean, and sunflower oils. Avocado. Seasoning and other foods Herbs. Spices. Seasoning mixes without salt. Unsalted popcorn and pretzels. Fat-free sweets. What foods are not recommended? The items listed may not be a complete list. Talk with your dietitian about what dietary choices are best for you. Grains Baked goods made with fat, such as croissants, muffins, or some breads. Dry pasta or rice meal packs. Vegetables Creamed or fried vegetables. Vegetables  in a cheese sauce. Regular canned vegetables (not low-sodium or reduced-sodium). Regular canned tomato sauce and paste (not low-sodium or reduced-sodium). Regular tomato and vegetable juice (not low-sodium or reduced-sodium). Angie Fava. Olives. Fruits Canned fruit in a light or heavy syrup. Fried fruit. Fruit in cream or butter sauce. Meat and other protein foods Fatty cuts of meat. Ribs. Fried meat. Berniece Salines. Sausage. Bologna and other processed lunch meats. Salami. Fatback. Hotdogs. Bratwurst. Salted nuts and seeds. Canned beans with added salt. Canned or smoked fish. Whole eggs or egg yolks. Chicken or [place] with skin. Dairy Whole or 2% milk, cream, and half-and-half. Whole or full-fat cream cheese. Whole-fat or sweetened yogurt. Full-fat cheese. Nondairy creamers. Whipped toppings. Processed cheese and cheese spreads. Fats and oils Butter. Stick margarine. Lard. Shortening. Ghee. Bacon fat. Tropical oils, such as coconut, palm kernel, or palm oil. Seasoning and other foods Salted popcorn and pretzels. Onion salt, garlic salt, seasoned salt, table salt, and sea salt. Worcestershire sauce. Tartar sauce. Barbecue sauce. Teriyaki sauce. Soy sauce, including reduced-sodium. Steak sauce. Canned and packaged gravies. Fish sauce. Oyster sauce. Cocktail sauce. Horseradish that you find on the shelf. Ketchup. Mustard. Meat flavorings and tenderizers. Bouillon cubes. Hot sauce and Tabasco sauce. Premade or packaged marinades. Premade or packaged taco seasonings. Relishes. Regular salad dressings. Where to find more information:  National Heart, Lung, and [place]: https://wilson-eaton.com/  American Heart Association: www.heart.org Summary  The DASH eating plan is a healthy eating plan that has been shown to reduce high blood pressure (hypertension). It may also reduce your risk for type 2 diabetes, heart disease, and stroke.  With the DASH eating plan, you should limit salt (sodium) intake to 2,300 mg a  day. If you have hypertension, you may need to reduce your sodium intake to 1,500 mg a day.  When on the DASH eating plan, aim to eat more fresh fruits and vegetables, whole grains, lean proteins, low-fat dairy, and heart-healthy fats.  Work with your health care provider or diet and nutrition specialist (dietitian) to adjust your eating plan to your individual calorie needs. This information is not intended to replace advice given to you by your health care provider. Make sure you discuss any questions you have with your health care provider. Document Released: 01/02/2011 Document Revised: 01/07/2016 Document Reviewed: 01/07/2016 Elsevier Interactive Patient Education  2018 [place] Years, Female Preventive care refers to lifestyle choices and visits with your health care provider that can promote health and wellness. What does preventive care include?  A yearly physical exam. This is also called an annual well check.  Dental exams once or  twice a year.  Routine eye exams. Ask your health care provider how often you should have your eyes checked.  Personal lifestyle choices, including: ? Daily care of your teeth and gums. ? Regular physical activity. ? Eating a healthy diet. ? Avoiding tobacco and drug use. ? Limiting alcohol use. ? Practicing safe sex. ? Taking low-dose aspirin daily starting at age 46. ? Taking vitamin and mineral supplements as recommended by your health care provider. What happens during an annual well check? The services and screenings done by your health care provider during your annual well check will depend on your age, overall health, lifestyle risk factors, and family history of disease. Counseling Your health care provider may ask you questions about your:  Alcohol use.  Tobacco use.  Drug use.  Emotional well-being.  Home and relationship well-being.  Sexual activity.  Eating habits.  Work and work  Statistician.  Method of birth control.  Menstrual cycle.  Pregnancy history.  Screening You may have the following tests or measurements:  Height, weight, and BMI.  Blood pressure.  Lipid and cholesterol levels. These may be checked every 5 years, or more frequently if you are over 51 years old.  Skin check.  Lung cancer screening. You may have this screening every year starting at age 60 if you have a 30-pack-year history of smoking and currently smoke or have quit within the past 15 years.  Fecal occult blood test (FOBT) of the stool. You may have this test every year starting at age 79.  Flexible sigmoidoscopy or colonoscopy. You may have a sigmoidoscopy every 5 years or a colonoscopy every 10 years starting at age 32.  Hepatitis C blood test.  Hepatitis B blood test.  Sexually transmitted disease (STD) testing.  Diabetes screening. This is done by checking your blood sugar (glucose) after you have not eaten for a while (fasting). You may have this done every 1-3 years.  Mammogram. This may be done every 1-2 years. Talk to your health care provider about when you should start having regular mammograms. This may depend on whether you have a family history of breast cancer.  BRCA-related cancer screening. This may be done if you have a family history of breast, ovarian, tubal, or peritoneal cancers.  Pelvic exam and Pap test. This may be done every 3 years starting at age 12. Starting at age 32, this may be done every 5 years if you have a Pap test in combination with an HPV test.  Bone density scan. This is done to screen for osteoporosis. You may have this scan if you are at high risk for osteoporosis.  Discuss your test results, treatment options, and if necessary, the need for more tests with your health care provider. Vaccines Your health care provider may recommend certain vaccines, such as:  Influenza vaccine. This is recommended every year.  Tetanus,  diphtheria, and acellular pertussis (Tdap, Td) vaccine. You may need a Td booster every 10 years.  Varicella vaccine. You may need this if you have not been vaccinated.  Zoster vaccine. You may need this after age 27.  Measles, mumps, and rubella (MMR) vaccine. You may need at least one dose of MMR if you were born in 1957 or later. You may also need a second dose.  Pneumococcal 13-valent conjugate (PCV13) vaccine. You may need this if you have certain conditions and were not previously vaccinated.  Pneumococcal polysaccharide (PPSV23) vaccine. You may need one or two doses if you smoke cigarettes  or if you have certain conditions.  Meningococcal vaccine. You may need this if you have certain conditions.  Hepatitis A vaccine. You may need this if you have certain conditions or if you travel or work in places where you may be exposed to hepatitis A.  Hepatitis B vaccine. You may need this if you have certain conditions or if you travel or work in places where you may be exposed to hepatitis B.  Haemophilus influenzae type b (Hib) vaccine. You may need this if you have certain conditions.  Talk to your health care provider about which screenings and vaccines you need and how often you need them. This information is not intended to replace advice given to you by your health care provider. Make sure you discuss any questions you have with your health care provider. Document Released: 02/09/2015 Document Revised: 10/03/2015 Document Reviewed: 11/14/2014 Elsevier Interactive Patient Education  2018 [place] Maintenance, Female Adopting a healthy lifestyle and getting preventive care can go a long way to promote health and wellness. Talk with your health care provider about what schedule of regular examinations is right for you. This is a good chance for you to check in with your provider about disease prevention and staying healthy. In between checkups, there are plenty of things you  can do on your own. Experts have done a lot of research about which lifestyle changes and preventive measures are most likely to keep you healthy. Ask your health care provider for more information. Weight and diet Eat a healthy diet  Be sure to include plenty of vegetables, fruits, low-fat dairy products, and lean protein.  Do not eat a lot of foods high in solid fats, added sugars, or salt.  Get regular exercise. This is one of the most important things you can do for your health. ? Most adults should exercise for at least 150 minutes each week. The exercise should increase your heart rate and make you sweat (moderate-intensity exercise). ? Most adults should also do strengthening exercises at least twice a week. This is in addition to the moderate-intensity exercise.  Maintain a healthy weight  Body mass index (BMI) is a measurement that can be used to identify possible weight problems. It estimates body fat based on height and weight. Your health care provider can help determine your BMI and help you achieve or maintain a healthy weight.  For females 15 years of age and older: ? A BMI below 18.5 is considered underweight. ? A BMI of 18.5 to 24.9 is normal. ? A BMI of 25 to 29.9 is considered overweight. ? A BMI of 30 and above is considered obese.  Watch levels of cholesterol and blood lipids  You should start having your blood tested for lipids and cholesterol at 41 years of age, then have this test every 5 years.  You may need to have your cholesterol levels checked more often if: ? Your lipid or cholesterol levels are high. ? You are older than 41 years of age. ? You are at high risk for heart disease.  Cancer screening Lung Cancer  Lung cancer screening is recommended for adults 33-67 years old who are at high risk for lung cancer because of a history of smoking.  A yearly low-dose CT scan of the lungs is recommended for people who: ? Currently smoke. ? Have quit within  the past 15 years. ? Have at least a 30-pack-year history of smoking. A pack year is smoking an average of one  pack of cigarettes a day for 1 year.  Yearly screening should continue until it has been 15 years since you quit.  Yearly screening should stop if you develop a health problem that would prevent you from having lung cancer treatment.  Breast Cancer  Practice breast self-awareness. This means understanding how your breasts normally appear and feel.  It also means doing regular breast self-exams. Let your health care provider know about any changes, no matter how small.  If you are in your 20s or 30s, you should have a clinical breast exam (CBE) by a health care provider every 1-3 years as part of a regular health exam.  If you are 37 or older, have a CBE every year. Also consider having a breast X-ray (mammogram) every year.  If you have a family history of breast cancer, talk to your health care provider about genetic screening.  If you are at high risk for breast cancer, talk to your health care provider about having an MRI and a mammogram every year.  Breast cancer gene (BRCA) assessment is recommended for women who have family members with BRCA-related cancers. BRCA-related cancers include: ? Breast. ? Ovarian. ? Tubal. ? Peritoneal cancers.  Results of the assessment will determine the need for genetic counseling and BRCA1 and BRCA2 testing.  Cervical Cancer Your health care provider may recommend that you be screened regularly for cancer of the pelvic organs (ovaries, uterus, and vagina). This screening involves a pelvic examination, including checking for microscopic changes to the surface of your cervix (Pap test). You may be encouraged to have this screening done every 3 years, beginning at age 94.  For women ages 25-65, health care providers may recommend pelvic exams and Pap testing every 3 years, or they may recommend the Pap and pelvic exam, combined with testing  for human papilloma virus (HPV), every 5 years. Some types of HPV increase your risk of cervical cancer. Testing for HPV may also be done on women of any age with unclear Pap test results.  Other health care providers may not recommend any screening for nonpregnant women who are considered low risk for pelvic cancer and who do not have symptoms. Ask your health care provider if a screening pelvic exam is right for you.  If you have had past treatment for cervical cancer or a condition that could lead to cancer, you need Pap tests and screening for cancer for at least 20 years after your treatment. If Pap tests have been discontinued, your risk factors (such as having a new sexual partner) need to be reassessed to determine if screening should resume. Some women have medical problems that increase the chance of getting cervical cancer. In these cases, your health care provider may recommend more frequent screening and Pap tests.  Colorectal Cancer  This type of cancer can be detected and often prevented.  Routine colorectal cancer screening usually begins at 41 years of age and continues through 41 years of age.  Your health care provider may recommend screening at an earlier age if you have risk factors for colon cancer.  Your health care provider may also recommend using home test kits to check for hidden blood in the stool.  A small camera at the end of a tube can be used to examine your colon directly (sigmoidoscopy or colonoscopy). This is done to check for the earliest forms of colorectal cancer.  Routine screening usually begins at age 91.  Direct examination of the colon should be repeated  every 5-10 years through 41 years of age. However, you may need to be screened more often if early forms of precancerous polyps or small growths are found.  Skin Cancer  Check your skin from head to toe regularly.  Tell your health care provider about any new moles or changes in moles, especially  if there is a change in a mole's shape or color.  Also tell your health care provider if you have a mole that is larger than the size of a pencil eraser.  Always use sunscreen. Apply sunscreen liberally and repeatedly throughout the day.  Protect yourself by wearing long sleeves, pants, a wide-brimmed hat, and sunglasses whenever you are outside.  Heart disease, diabetes, and high blood pressure  High blood pressure causes heart disease and increases the risk of stroke. High blood pressure is more likely to develop in: ? People who have blood pressure in the high end of the normal range (130-139/85-89 mm Hg). ? People who are overweight or obese. ? People who are African American.  If you are 52-89 years of age, have your blood pressure checked every 3-5 years. If you are 1 years of age or older, have your blood pressure checked every year. You should have your blood pressure measured twice-once when you are at a hospital or clinic, and once when you are not at a hospital or clinic. Record the average of the two measurements. To check your blood pressure when you are not at a hospital or clinic, you can use: ? An automated blood pressure machine at a pharmacy. ? A home blood pressure monitor.  If you are between 49 years and 67 years old, ask your health care provider if you should take aspirin to prevent strokes.  Have regular diabetes screenings. This involves taking a blood sample to check your fasting blood sugar level. ? If you are at a normal weight and have a low risk for diabetes, have this test once every three years after 41 years of age. ? If you are overweight and have a high risk for diabetes, consider being tested at a younger age or more often. Preventing infection Hepatitis B  If you have a higher risk for hepatitis B, you should be screened for this virus. You are considered at high risk for hepatitis B if: ? You were born in a country where hepatitis B is common. Ask  your health care provider which countries are considered high risk. ? Your parents were born in a high-risk country, and you have not been immunized against hepatitis B (hepatitis B vaccine). ? You have HIV or AIDS. ? You use needles to inject street drugs. ? You live with someone who has hepatitis B. ? You have had sex with someone who has hepatitis B. ? You get hemodialysis treatment. ? You take certain medicines for conditions, including cancer, organ transplantation, and autoimmune conditions.  Hepatitis C  Blood testing is recommended for: ? Everyone born from [birth year] through 1964. ? Anyone with known risk factors for hepatitis C.  Sexually transmitted infections (STIs)  You should be screened for sexually transmitted infections (STIs) including gonorrhea and chlamydia if: ? You are sexually active and are younger than 41 years of age. ? You are older than 41 years of age and your health care provider tells you that you are at risk for this type of infection. ? Your sexual activity has changed since you were last screened and you are at an increased risk for chlamydia  or gonorrhea. Ask your health care provider if you are at risk.  If you do not have HIV, but are at risk, it may be recommended that you take a prescription medicine daily to prevent HIV infection. This is called pre-exposure prophylaxis (PrEP). You are considered at risk if: ? You are sexually active and do not regularly use condoms or know the HIV status of your partner(s). ? You take drugs by injection. ? You are sexually active with a partner who has HIV.  Talk with your health care provider about whether you are at high risk of being infected with HIV. If you choose to begin PrEP, you should first be tested for HIV. You should then be tested every 3 months for as long as you are taking PrEP. Pregnancy  If you are premenopausal and you may become pregnant, ask your health care provider about preconception  counseling.  If you may become pregnant, take 400 to 800 micrograms (mcg) of folic acid every day.  If you want to prevent pregnancy, talk to your health care provider about birth control (contraception). Osteoporosis and menopause  Osteoporosis is a disease in which the bones lose minerals and strength with aging. This can result in serious bone fractures. Your risk for osteoporosis can be identified using a bone density scan.  If you are 69 years of age or older, or if you are at risk for osteoporosis and fractures, ask your health care provider if you should be screened.  Ask your health care provider whether you should take a calcium or vitamin D supplement to lower your risk for osteoporosis.  Menopause may have certain physical symptoms and risks.  Hormone replacement therapy may reduce some of these symptoms and risks. Talk to your health care provider about whether hormone replacement therapy is right for you. Follow these instructions at home:  Schedule regular health, dental, and eye exams.  Stay current with your immunizations.  Do not use any tobacco products including cigarettes, chewing tobacco, or electronic cigarettes.  If you are pregnant, do not drink alcohol.  If you are breastfeeding, limit how much and how often you drink alcohol.  Limit alcohol intake to no more than 1 drink per day for nonpregnant women. One drink equals 12 ounces of beer, 5 ounces of wine, or 1 ounces of hard liquor.  Do not use street drugs.  Do not share needles.  Ask your health care provider for help if you need support or information about quitting drugs.  Tell your health care provider if you often feel depressed.  Tell your health care provider if you have ever been abused or do not feel safe at home. This information is not intended to replace advice given to you by your health care provider. Make sure you discuss any questions you have with your health care provider. Document  Released: 07/29/2010 Document Revised: 06/21/2015 Document Reviewed: 10/17/2014 Elsevier Interactive Patient Education  [employer].

## 2017-09-01 NOTE — Progress Notes (Signed)
Subjective:  Yvonne Murray is a 42 y.o. female who presents for basic physical exam. Patient concerned about her blood pressure.  The patient states several years ago she had episodes of syncope and was taken to the ER.  At that time the patient was informed that her blood pressure was elevated.  The patient was started on lisinopril, and she could not recall the name of the other medication.  The patient states she had an allergic reaction to the medicine, which included palpitations, sneezing, and a cough.  The patient states she stopped the medication and did not return to the doctor or has not returned to the doctor since that time.  The patient denies any symptoms such as blurred vision, headache, but states she does have some swelling in her legs as she stands at work most of the day.  The patient denies any other past medical history to include lung disease, kidney disease, diabetes, seizures, or high cholesterol.  She is unsure if her immunizations are up-to-date.  The patient does admit to a surgical history of abdominal surgery and tubal ligation which is correct in her record.  The patient does admit to the use of marijuana.  Patient also admits to drinking 2-3 drinks per month.  The patient denies cigarette smoking.  The patient has a significant past family medical history of hypertension.  States her father passed away in his 47s and she thinks he had a hypertension, her mother passed away at age 92 and she believes she had hypertension along with some other illnesses.  The patient has 3 children, one son and 2 daughters who are healthy.  The patient states she exercises about 2-3 times a week which include walking in her neighborhood, and states that she does have concern about her diet but does avoid salt, pork, and red meat.  Past Medical History:  Diagnosis Date  . Hypertension     Past Surgical History:  Procedure Laterality Date  . ABDOMINAL SURGERY    . TUBAL LIGATION       Social History   Tobacco Use  . Smoking status: Never Smoker  . Smokeless tobacco: Never Used  Substance Use Topics  . Alcohol use: Yes    Comment: occasional   . Drug use: Yes    Types: Marijuana    Allergies  Allergen Reactions  . Lisinopril Cough    Sensitivity to sun. Uncontrollable sneezing & coughing     Review of Systems  Constitutional: Negative.   HENT: Negative.   Eyes: Negative.   Respiratory: Negative.   Cardiovascular: Positive for leg swelling.  Gastrointestinal: Negative.   Genitourinary: Negative.   Musculoskeletal: Negative.   Skin: Negative.   Neurological: Negative.   Endo/Heme/Allergies: Negative.   Psychiatric/Behavioral: Negative.     Objective:  BP (!) 146/108 (BP Location: Right Arm, Patient Position: Sitting, Cuff Size: Normal)   Pulse 70   Temp 98.7 F (37.1 C) (Oral)   Resp 17   Wt 173 lb 3.2 oz (78.6 kg)   SpO2 98%   BMI 30.68 kg/m   General Appearance:  Alert, cooperative, no distress, appears stated age  Head:  Normocephalic, without obvious abnormality, atraumatic  Eyes:  PERRL, conjunctiva/corneas clear, EOM's intact, fundi benign, both eyes  Ears:  Normal TM's and external ear canals, both ears  Nose: Nares normal, septum midline,mucosa normal, no drainage or sinus tenderness  Throat: Lips, mucosa, and tongue normal; teeth and gums normal  Neck: Supple, symmetrical, trachea midline, no  adenopathy;  thyroid: not enlarged, symmetric, no tenderness/mass/nodules; no carotid bruit or JVD  Back:   Symmetric, no curvature, ROM normal, no CVA tenderness  Lungs:   Clear to auscultation bilaterally, respirations unlabored  Breasts:  Deferred  Heart:  Regular rate and rhythm, S1 and S2 normal, no murmur, rub, or gallop; + bilateral lower extremity edema, +1 pitting bilaterally, no pedal edema  Abdomen:   Soft, non-tender, bowel sounds active all four quadrants,  no masses, no organomegaly  Pelvic: Deferred  Extremities: Extremities  normal, atraumatic, no cyanosis or edema  Pulses: 2+ and symmetric  Skin: Skin color, texture, turgor normal, no rashes or lesions  Lymph nodes: Cervical, supraclavicular normal  Neurologic: Normal   Repeated patient's blood pressure: 160/98   Assessment:  basic physical exam and Elevated Blood Pressure    Plan:   Exam findings, diagnosis etiology and medication use and indications reviewed with patient. Follow- Up and discharge instructions provided.  Concern for patient's elevated blood pressure during exam.  Discussed with patient that she will need to go to the ER if she develop palpitations, shortness of breath, or difficulty breathing.  Also recommended to patient t to go for an evaluation if she develops headache, blurred vision, or dizziness.  Patient was given [facility].  Unable to make appointment while the patient was here as the office was closed at this time.  Patient was instructed that it is detrimental that she make this appointment and follow-up.  Patient was given patient education on managing hypertension, the DASH diet, health maintenance, and health prevention.  Patient instructed that she does need to follow-up with internal medicine to establish primary care, have labs, and request appropriate screenings to be done.  Patient verbalized understanding of information provided and agrees with plan of care (POC), all questions answered.  1. Wellness examination Concern for patient's elevated blood pressure during exam.  Discussed with patient that she will need to go to the ER if she develop palpitations, shortness of breath, or difficulty breathing.  Also recommended to patient t to go for an evaluation if she develops headache, blurred vision, or dizziness.  Patient was given instructions to [facility].   2. Elevated blood pressure reading Concern for patient's elevated blood pressure during  exam.  Discussed with patient that she will need to go to the ER if she develop palpitations, shortness of breath, or difficulty breathing.  Also recommended to patient t to go for an evaluation if she develops headache, blurred vision, or dizziness.  Patient was given instructions to follow-up at [facility].  Unable to make appointment while the patient was here as the office was closed at this time.  Patient was instructed that it is detrimental that she make this appointment and follow-up.  Patient was given patient education on managing hypertension, the DASH diet, health maintenance, and health prevention.  Patient instructed that she does need to follow-up with internal medicine to establish primary care, have labs, and request appropriate screenings to be done.

## 2017-09-11 ENCOUNTER — Other Ambulatory Visit: Payer: Self-pay

## 2017-09-11 ENCOUNTER — Encounter: Payer: Self-pay | Admitting: Internal Medicine

## 2017-09-11 ENCOUNTER — Ambulatory Visit: Payer: Self-pay | Admitting: Internal Medicine

## 2017-09-11 VITALS — BP 173/89 | HR 67 | Temp 99.0°F | Ht 63.0 in | Wt 167.8 lb

## 2017-09-11 DIAGNOSIS — R55 Syncope and collapse: Secondary | ICD-10-CM

## 2017-09-11 DIAGNOSIS — Z79899 Other long term (current) drug therapy: Secondary | ICD-10-CM

## 2017-09-11 DIAGNOSIS — I10 Essential (primary) hypertension: Secondary | ICD-10-CM

## 2017-09-11 MED ORDER — AMLODIPINE BESYLATE 5 MG PO TABS: 5.0000 mg | ORAL_TABLET | Freq: Every day | ORAL | 1 refills | Status: AC

## 2017-09-11 MED FILL — AMLODIPINE BESYLATE 5 MG TA: 5 | 30 days supply | Qty: 30 | Fill #0

## 2017-09-11 NOTE — Patient Instructions (Addendum)
Ms. Yvonne Murray,  It was a pleasure seeing in taking care of you today.  Your blood pressure still elevated here at the clinic and I am going to start you on these medications and recommendation:  1.  Amlodipine 5 mg daily. 2.  I would like to have you follow-up with me after your meeting with Deb for the orange card 3.  We are working for you to get plugged in with the [facility] for cervical cancer screening.  ~Dr. Dortha SchwalbeAgyei

## 2017-09-11 NOTE — Progress Notes (Signed)
CC: Elevated BP  HPI:  [name] is a 42 y.o. with hypertension and history of syncope of unclear etiology presenting for evaluation of high blood pressure.  She reports of recently being evaluated at an urgent care and was told she had elevated blood pressures (146/108 with repeat of 160/98).  She reports that in 2013, she had a syncopal episode and in the ED she was found to have elevated BP.  She was started on lisinopril-hydrochlorothiazide 20-12.5 mg but however developed cough and sneezing which she later self discontinued.  During that admission, she was evaluated by cardiology with no clear etiology of her syncope.  She had a normal echo with EF 60%.  She was scheduled for a 30-day Holter monitor and able to forward.  In September 2018, she reports experiencing syncopal episode that lasted for only seconds preceded by prodrome of diaphoresis and lightheadedness.  She denies chest pain, palpitation, shortness of breath, headaches, abdominal pain, nausea during her syncopal episode.  Currently, she states she is doing well with no complaints of chest pain, palpitation, shortness of breath, lightheadedness, dizziness, headaches, abdominal pain, nausea, vomiting.  Hypertension: Initially diagnosed in 2013 at the emergency department after a syncopal episode.  She was started on lisinopril-hydrochlorothiazide 20-12.5 mg daily but however developed cough and sneezing after medication initiation and she self discontinued.  BP elevated in clinic today 173/89 pulse 67.  Given her previous history of possible allergy to HCTZ, I will start her calcium channel blocker and have close follow-up. -Start amlodipine 5 mg daily  History of syncope: In 2013, she had a syncopal episode and in the ED she was found to have elevated BP. During that admission, she was evaluated by cardiology with no clear etiology of her syncope.  She had a normal echo with EF 60%.  She was scheduled for a 30-day Holter  monitor and able to forward.  In September 2018, she reports experiencing a syncopal episode that lasted for only seconds preceded by prodrome of diaphoresis and lightheadedness.  She denies chest pain, palpitation, shortness of breath, headaches, abdominal pain, nausea during her syncopal episode.  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  Also, patient is currently uninsured but has an appointment for financial assistance on 10/09/2017. -Will consider referral to cardiology after patient obtains orange card.  Past Medical History:  Diagnosis Date  . Hypertension    Review of Systems:   Review of Systems  Constitutional: Negative for chills, fever, malaise/fatigue and weight loss.  Eyes: Negative for blurred vision and double vision.  Respiratory: Negative for cough.   Cardiovascular: Negative for chest pain and palpitations.  Gastrointestinal: Negative for abdominal pain, nausea and vomiting.  Skin: Negative for rash.  Neurological: Negative for dizziness and headaches.  Psychiatric/Behavioral: Negative.     Physical Exam:  Vitals:   09/11/17 1452  BP: (!) 173/89  Pulse: 67  Temp: 99 F (37.2 C)  TempSrc: Oral  SpO2: 100%  Weight: 167 lb 12.8 oz (76.1 kg)  Height: 5\' 3"  (1.6 m)   Physical Exam  Constitutional: She is well-developed, well-nourished, and in no distress.  HENT:  Head: Normocephalic and atraumatic.  Neck: Neck supple.  Cardiovascular: Normal rate and regular rhythm. Exam reveals no gallop and no friction rub.  No murmur heard. Pulmonary/Chest: Effort normal and breath sounds normal.  Abdominal: Soft. Bowel sounds are normal. She exhibits no distension. There is no tenderness.  Neurological: She  is alert.  Psychiatric: Mood and affect normal.      Assessment & Plan:   See Encounters Tab for problem based charting.  Patient discussed with Dr. Rogelia BogaButcher

## 2017-09-13 ENCOUNTER — Encounter: Payer: Self-pay | Admitting: Internal Medicine

## 2017-09-13 NOTE — Assessment & Plan Note (Signed)
Hypertension: Initially diagnosed in 2013 at the emergency department after a syncopal episode.  She was started on lisinopril-hydrochlorothiazide 20-12.5 mg daily but however developed cough and sneezing after medication initiation and she self discontinued.  BP elevated in clinic today 173/89 pulse 67.  Given her previous history of possible allergy to HCTZ, I will start her calcium channel blocker and have close follow-up. -Start amlodipine 5 mg daily

## 2017-09-13 NOTE — Assessment & Plan Note (Signed)
History of syncope: In 2013, she had a syncopal episode and in the ED she was found to have elevated BP. During that admission, she was evaluated by cardiology with no clear etiology of her syncope.  She had a normal echo with EF 60%.  She was scheduled for a 30-day Holter monitor and able to forward.  In September 2018, she reports experiencing a syncopal episode that lasted for only seconds preceded by prodrome of diaphoresis and lightheadedness.  She denies chest pain, palpitation, shortness of breath, headaches, abdominal pain, nausea during her syncopal episode.  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  Also, patient is currently uninsured but has an appointment for financial assistance on 10/09/2017. -Will consider referral to cardiology after patient obtains orange card.

## 2017-09-14 NOTE — Progress Notes (Signed)
Internal Medicine Clinic Attending  I saw and evaluated the patient.  I personally confirmed the key portions of the history and exam documented by Dr. Agyei and I reviewed pertinent patient test results.  The assessment, diagnosis, and plan were formulated together and I agree with the documentation in the resident's note.  

## 2017-09-15 ENCOUNTER — Other Ambulatory Visit: Payer: Self-pay | Admitting: Obstetrics and Gynecology

## 2017-09-15 DIAGNOSIS — Z1231 Encounter for screening mammogram for malignant neoplasm of breast: Secondary | ICD-10-CM

## 2017-09-24 ENCOUNTER — Telehealth: Payer: Self-pay | Admitting: Internal Medicine

## 2017-09-24 NOTE — Telephone Encounter (Signed)
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 And she will be able to come and pick it up.

## 2017-09-24 NOTE — Telephone Encounter (Signed)
Pt needs a note for her employer regarding her blood pressure results and medicine she is currently own, pt contact# [phone]

## 2017-09-25 NOTE — Telephone Encounter (Signed)
Stated she will need the letter before Sept 5.

## 2017-09-28 ENCOUNTER — Other Ambulatory Visit: Payer: Self-pay | Admitting: Internal Medicine

## 2017-09-28 NOTE — Telephone Encounter (Signed)
Hi Yvonne Murray,   I will put the note in my box at the clinic.   Thanks

## 2017-09-29 NOTE — Telephone Encounter (Signed)
Thanks

## 2017-09-30 NOTE — Telephone Encounter (Signed)
Pt request letter to be fax to [phone] Attn: Judeth Cornfield Ward ([email or web address]) - done. She will pick up letter after 4PM today.

## 2017-10-09 ENCOUNTER — Ambulatory Visit: Payer: Self-pay

## 2017-11-12 ENCOUNTER — Encounter: Payer: Self-pay | Admitting: Internal Medicine

## 2017-11-12 ENCOUNTER — Ambulatory Visit: Payer: Self-pay

## 2017-12-07 ENCOUNTER — Other Ambulatory Visit (HOSPITAL_COMMUNITY): Payer: Self-pay | Admitting: *Deleted

## 2017-12-07 DIAGNOSIS — Z0000 Encounter for general adult medical examination without abnormal findings: Secondary | ICD-10-CM

## 2017-12-08 ENCOUNTER — Encounter (HOSPITAL_COMMUNITY): Payer: Self-pay

## 2017-12-08 ENCOUNTER — Ambulatory Visit (HOSPITAL_COMMUNITY)
Admission: RE | Admit: 2017-12-08 | Discharge: 2017-12-08 | Disposition: A | Discharge status: Home / Self Care | Payer: Self-pay | Source: Ambulatory Visit | Attending: Obstetrics and Gynecology | Admitting: Obstetrics and Gynecology

## 2017-12-08 ENCOUNTER — Ambulatory Visit
Admission: RE | Admit: 2017-12-08 | Discharge: 2017-12-08 | Disposition: A | Discharge status: Home / Self Care | Payer: No Typology Code available for payment source | Source: Ambulatory Visit | Attending: Obstetrics and Gynecology | Admitting: Obstetrics and Gynecology

## 2017-12-08 VITALS — BP 120/82 | Wt 168.0 lb

## 2017-12-08 DIAGNOSIS — Z01419 Encounter for gynecological examination (general) (routine) without abnormal findings: Secondary | ICD-10-CM

## 2017-12-08 DIAGNOSIS — Z1231 Encounter for screening mammogram for malignant neoplasm of breast: Secondary | ICD-10-CM

## 2017-12-08 NOTE — Patient Instructions (Signed)
Explained breast self awareness with Yvonne Murray. Let patient know [facility] will cover Pap smears and HPV typing every 5 years unless has a history of abnormal Pap smears. [facility]. Appointment scheduled for Tuesday, December 08, 2017 at 1510. Patient aware of appointment and will be there. Let patient know will follow up with her within the next couple weeks with results of Pap smear by letter or phone. Informed patient that the Breast Center will follow-up with her within the next couple of weeks with results of mammogram by letter or phone. Yvonne Murray verbalized understanding.  [place], Kathaleen Maserhristine Poll, RN 4:03 PM

## 2017-12-08 NOTE — Progress Notes (Signed)
No complaints today.   Pap Smear: Pap smear completed today. Last Pap smear was over four years ago and normal with negative HPV. Per patient has a history of an abnormal Pap smear 20 years ago that a colposcopy was completed for follow-up. Patient stated all of her Pap smears have been normal since colposcopy and that she has had at least three normal Pap smears. No Pap smear results are in Epic.  Physical exam: Breasts Breasts symmetrical. No skin abnormalities bilateral breasts. Bilateral nipple inversion that per patient is normal for her. No nipple discharge bilateral breasts. No lymphadenopathy. No lumps palpated bilateral breasts. No complaints of pain or tenderness on exam. Referred patient to the [facility] for a screening mammogram. Appointment scheduled for Tuesday, December 08, 2017 at 1510.        Pelvic/Bimanual   Ext Genitalia No lesions, no swelling and no discharge observed on external genitalia.         Vagina Vagina pink and normal texture. No lesions or discharge observed in vagina.          Cervix Cervix is present. Cervix pink and of normal texture. Blood observed on cervical os that per patient is consistent with the end of her menstrual period.    Uterus Uterus is present and palpable. Uterus in normal position and normal size.        Adnexae Bilateral ovaries present and palpable. No tenderness on palpation.         Rectovaginal No rectal exam completed today since patient had no rectal complaints. No skin abnormalities observed on exam.    Smoking History: Patient has never smoked.  Patient Navigation: Patient education provided. Access to services provided for patient through [facility] program.   Breast and Cervical Cancer Risk Assessment: Patient has no family history of breast cancer, known genetic mutations, or radiation treatment to the chest before age 25. Per patient has a history of cervical dysplasia. Patient has no history of being  immunocompromised or DES exposure in-utero.  Risk Assessment    Risk Scores      12/08/2017   Last edited by: Lynnell Dike, LPN   5-year risk: 0.6 %   Lifetime risk: 9.6 %

## 2017-12-09 ENCOUNTER — Other Ambulatory Visit (HOSPITAL_COMMUNITY): Payer: Self-pay | Admitting: *Deleted

## 2017-12-11 ENCOUNTER — Inpatient Hospital Stay: Payer: Self-pay | Attending: Obstetrics and Gynecology | Admitting: *Deleted

## 2017-12-11 ENCOUNTER — Inpatient Hospital Stay: Payer: Self-pay

## 2017-12-11 VITALS — BP 130/70 | Ht 62.0 in | Wt 165.0 lb

## 2017-12-11 DIAGNOSIS — Z0000 Encounter for general adult medical examination without abnormal findings: Secondary | ICD-10-CM

## 2017-12-11 LAB — LIPID PANEL
CHOLESTEROL: 241 mg/dL — AB (ref 0–200)
HDL: 90 mg/dL (ref >40)
LDL CALC: 142 mg/dL — AB (ref 0–99)
TRIGLYCERIDES: 43 mg/dL (ref <150)
Total CHOL/HDL Ratio: 2.7 RATIO
VLDL: 9 mg/dL (ref 0–40)

## 2017-12-11 LAB — HEMOGLOBIN A1C
Hgb A1c MFr Bld: 5.4 % (ref 4.8–5.6)
Mean Plasma Glucose: 108.28 mg/dL

## 2017-12-11 NOTE — Progress Notes (Signed)
[facility] initial screening  Clinical Measurement:  Height: 62in Weight: 165lb  Blood Pressure: 138/74  Blood Pressure #2: 130/70   Fasting Labs Drawn Today, will review with patient when they result.  Medical History:  Patient states that she has not been diagnosed with high cholesterol,  diabetes or heart disease.  Patient states that she has been diagnosed with high blood pressure.  Medications:  Patients states she is not taking any medications for high cholesterol,  or diabetes.  She is not taking aspirin daily to prevent heart attack or stroke.  Patient state that she is takinghigh blood pressure  Medication.  Blood pressure, self measurement:  Patients states she does measure blood pressure at home and occasionally shares it with a health care provider.  Nutrition:  Patient states she eats 1 cup of fruit and 1 cup of vegetables in an average day.  Patient states she does  eat fish regularly, she eats more than half a serving of whole grains daily. She drinks less than 36 ounces of beverages with added sugar weekly.  She is currently watching her sodium intake.  She has not had 1  drink containing alcohol in the last seven days.    Physical activity:  Patient states that she gets 0 minutes of moderate exercise in a week.  She gets 0 minutes of vigorous exercise per week.    Smoking status:  Patient states she has never smoked and is not around any smokers.    Quality of life:  Patient states that she has had 0 bad physical days out of the last 30 days. In the last 2 weeks, she has had 0 days that she has felt down or depressed. She has had 0 days in the last 2 weeks that she has had little interest or pleasure in doing things.  Risk reduction and counseling:  Patient states she wants to  increase fruit and vegetable intake.  I encouraged her to start an  exercise regimen and increase vegetable and fruit intake.  Navigation:  I will notify patient of lab results.  Patient is aware of 2 more  health coaching sessions and a follow up.

## 2017-12-11 NOTE — Addendum Note (Signed)
Addended by: Deforest HoylesMORRIS, Oletta Buehring D on: 12/11/2017 09:15 AM   Modules accepted: Orders

## 2017-12-14 ENCOUNTER — Telehealth (HOSPITAL_COMMUNITY): Payer: Self-pay | Admitting: *Deleted

## 2017-12-14 LAB — CYTOLOGY - PAP
DIAGNOSIS: NEGATIVE
HPV: DETECTED — AB

## 2017-12-14 NOTE — Telephone Encounter (Signed)
Health coaching 2  Labs-cholesterol 241, LDL cholesterol 142, triglycerides 43, HDL cholesterol  90, hemoglobin A1C 5.4  Patient is aware and understands these lab results.  Goals-Patient states that she will increase fruit and vegetable consumption.  I encouraged patient to eat at least 5 fruits and vegetables daily.  Patient states that she walks a lot with her job.  I encouraged patient to walk additional 3 to 5 times weekly at 30 minute increments.  Patient states that she eats oatmeal 1x/week, I encouraged patient to eat oatmeal 3 or more times weekly.  Navigation:  Patient is aware of 1 more health coaching sessions and a follow up.  Time- 10 minutes

## 2017-12-30 ENCOUNTER — Telehealth: Payer: Self-pay

## 2018-02-23 ENCOUNTER — Encounter (HOSPITAL_COMMUNITY): Payer: Self-pay | Admitting: *Deleted

## 2018-02-23 NOTE — Progress Notes (Signed)
Letter mailed to patient with negative pap smear results. 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 Next pap smear due in one. Year.

## 2018-07-12 ENCOUNTER — Telehealth: Payer: Self-pay

## 2018-07-12 NOTE — Telephone Encounter (Signed)
Health Coaching 3     Goals- During the last health coaching session the patient was working on the goals of increasing her fruit and vegetable intake. Walking more daily and increasing amount of whole grains consumed daily. Patient states that she has been consuming 2-3 servings of vegetables daily and 2 servings of fruit maybe twice a week. She has been consuming more than a serving of whole grains daily.   New goal-  I encouraged her to continue eating the same amount of vegetables and whole grains daily. The new goal that we set was to increase the amount of fruits consumed daily. She stated that she does like fruit smoothies. I went over what amounts of different fruits would need to be added to get her daily servings in. We also talked about continuing the amount of daily movement she gets.She stated that she likes to move around and dance. I encouraged her to try and get 20-30 minutes daily.    Barrier to reaching goal- none   Strategies to overcome- none   Navigation:  Patient is aware of an in-person follow up session and is scheduled for July 26, 2018 at 9:30 am   Time- 13 minutes

## 2018-07-15 ENCOUNTER — Other Ambulatory Visit (HOSPITAL_COMMUNITY): Payer: Self-pay | Admitting: *Deleted

## 2018-07-15 DIAGNOSIS — Z1231 Encounter for screening mammogram for malignant neoplasm of breast: Secondary | ICD-10-CM

## 2018-07-26 ENCOUNTER — Ambulatory Visit: Payer: No Typology Code available for payment source

## 2018-08-04 ENCOUNTER — Telehealth: Payer: Self-pay

## 2018-08-04 NOTE — Telephone Encounter (Signed)
Left message with patient asking if she would like to reschedule missed [facility] appointment that was scheduled on 07/26/2018. Left name and number for her to call back if she would like to reschedule.

## 2018-09-22 ENCOUNTER — Encounter: Payer: No Typology Code available for payment source | Admitting: Internal Medicine

## 2018-09-22 ENCOUNTER — Encounter: Payer: Self-pay | Admitting: Internal Medicine

## 2018-12-16 ENCOUNTER — Ambulatory Visit (HOSPITAL_COMMUNITY): Payer: No Typology Code available for payment source

## 2021-04-29 ENCOUNTER — Encounter: Payer: No Typology Code available for payment source | Admitting: Medical

## 2021-07-24 ENCOUNTER — Telehealth: Payer: Self-pay

## 2021-07-24 NOTE — Telephone Encounter (Signed)
Telephoned patient at mobile phone. Left a voice message with [facility] scheduling contact information.

## 2021-09-02 NOTE — Telephone Encounter (Signed)
Telephoned patient at home number. Left a voice message with [facility] scheduling information. 

## 2022-12-17 NOTE — Telephone Encounter (Signed)
Error
